# Patient Record
Sex: Female | Born: 1940 | Race: White | Hispanic: No | Smoking: Former smoker
Health system: Southern US, Community
[De-identification: ages and names within clinical notes are randomized; demographics above are authoritative.]

## PROBLEM LIST (undated history)

## (undated) DIAGNOSIS — IMO0001 Reserved for inherently not codable concepts without codable children: Secondary | ICD-10-CM

## (undated) DIAGNOSIS — I2782 Chronic pulmonary embolism: Secondary | ICD-10-CM

## (undated) DIAGNOSIS — D3000 Benign neoplasm of unspecified kidney: Secondary | ICD-10-CM

## (undated) DIAGNOSIS — D494 Neoplasm of unspecified behavior of bladder: Secondary | ICD-10-CM

## (undated) DIAGNOSIS — M810 Age-related osteoporosis without current pathological fracture: Secondary | ICD-10-CM

## (undated) DIAGNOSIS — J439 Emphysema, unspecified: Secondary | ICD-10-CM

## (undated) DIAGNOSIS — Z9181 History of falling: Secondary | ICD-10-CM

## (undated) DIAGNOSIS — E039 Hypothyroidism, unspecified: Secondary | ICD-10-CM

## (undated) DIAGNOSIS — L900 Lichen sclerosus et atrophicus: Secondary | ICD-10-CM

## (undated) DIAGNOSIS — H269 Unspecified cataract: Secondary | ICD-10-CM

## (undated) DIAGNOSIS — F329 Major depressive disorder, single episode, unspecified: Secondary | ICD-10-CM

## (undated) DIAGNOSIS — Z5189 Encounter for other specified aftercare: Secondary | ICD-10-CM

## (undated) DIAGNOSIS — T7840XA Allergy, unspecified, initial encounter: Secondary | ICD-10-CM

## (undated) DIAGNOSIS — C801 Malignant (primary) neoplasm, unspecified: Secondary | ICD-10-CM

## (undated) DIAGNOSIS — I10 Essential (primary) hypertension: Secondary | ICD-10-CM

## (undated) DIAGNOSIS — F32A Depression, unspecified: Secondary | ICD-10-CM

## (undated) DIAGNOSIS — T8859XA Other complications of anesthesia, initial encounter: Secondary | ICD-10-CM

## (undated) DIAGNOSIS — F419 Anxiety disorder, unspecified: Secondary | ICD-10-CM

## (undated) DIAGNOSIS — E785 Hyperlipidemia, unspecified: Secondary | ICD-10-CM

## (undated) DIAGNOSIS — T4145XA Adverse effect of unspecified anesthetic, initial encounter: Secondary | ICD-10-CM

## (undated) HISTORY — DX: Unspecified cataract: H26.9

## (undated) HISTORY — DX: Emphysema, unspecified: J43.9

## (undated) HISTORY — PX: POLYPECTOMY: SHX149

## (undated) HISTORY — DX: Major depressive disorder, single episode, unspecified: F32.9

## (undated) HISTORY — DX: Lichen sclerosus et atrophicus: L90.0

## (undated) HISTORY — DX: Encounter for other specified aftercare: Z51.89

## (undated) HISTORY — DX: Allergy, unspecified, initial encounter: T78.40XA

## (undated) HISTORY — DX: Benign neoplasm of unspecified kidney: D30.00

## (undated) HISTORY — DX: Hypothyroidism, unspecified: E03.9

## (undated) HISTORY — DX: Malignant (primary) neoplasm, unspecified: C80.1

## (undated) HISTORY — DX: Age-related osteoporosis without current pathological fracture: M81.0

## (undated) HISTORY — DX: Anxiety disorder, unspecified: F41.9

## (undated) HISTORY — DX: Essential (primary) hypertension: I10

## (undated) HISTORY — PX: COLONOSCOPY: SHX174

## (undated) HISTORY — DX: Depression, unspecified: F32.A

## (undated) HISTORY — DX: Hyperlipidemia, unspecified: E78.5

---

## 1968-12-16 HISTORY — PX: VAGINAL HYSTERECTOMY: SUR661

## 2008-04-23 ENCOUNTER — Encounter: Admission: RE | Admit: 2008-04-23 | Discharge: 2008-04-23 | Payer: Self-pay | Admitting: Internal Medicine

## 2008-06-14 ENCOUNTER — Encounter: Admission: RE | Admit: 2008-06-14 | Discharge: 2008-06-14 | Payer: Self-pay | Admitting: Nephrology

## 2008-07-02 ENCOUNTER — Encounter: Admission: RE | Admit: 2008-07-02 | Discharge: 2008-07-02 | Payer: Self-pay | Admitting: Internal Medicine

## 2008-10-15 ENCOUNTER — Encounter: Admission: RE | Admit: 2008-10-15 | Discharge: 2008-10-15 | Payer: Self-pay | Admitting: Nephrology

## 2009-01-15 ENCOUNTER — Encounter: Admission: RE | Admit: 2009-01-15 | Discharge: 2009-01-15 | Payer: Self-pay | Admitting: Internal Medicine

## 2009-02-01 DIAGNOSIS — E785 Hyperlipidemia, unspecified: Secondary | ICD-10-CM | POA: Insufficient documentation

## 2009-02-01 DIAGNOSIS — D3000 Benign neoplasm of unspecified kidney: Secondary | ICD-10-CM | POA: Insufficient documentation

## 2009-02-01 DIAGNOSIS — E039 Hypothyroidism, unspecified: Secondary | ICD-10-CM | POA: Insufficient documentation

## 2009-02-01 DIAGNOSIS — J439 Emphysema, unspecified: Secondary | ICD-10-CM

## 2009-02-02 ENCOUNTER — Ambulatory Visit: Payer: Self-pay | Admitting: Pulmonary Disease

## 2009-02-02 DIAGNOSIS — R0602 Shortness of breath: Secondary | ICD-10-CM | POA: Insufficient documentation

## 2009-02-10 ENCOUNTER — Telehealth: Payer: Self-pay | Admitting: Pulmonary Disease

## 2009-02-17 ENCOUNTER — Ambulatory Visit: Payer: Self-pay | Admitting: Pulmonary Disease

## 2009-02-17 DIAGNOSIS — J438 Other emphysema: Secondary | ICD-10-CM | POA: Insufficient documentation

## 2009-04-30 ENCOUNTER — Telehealth (INDEPENDENT_AMBULATORY_CARE_PROVIDER_SITE_OTHER): Payer: Self-pay | Admitting: *Deleted

## 2009-05-19 ENCOUNTER — Ambulatory Visit: Payer: Self-pay | Admitting: Pulmonary Disease

## 2009-06-28 ENCOUNTER — Encounter: Admission: RE | Admit: 2009-06-28 | Discharge: 2009-06-28 | Payer: Self-pay | Admitting: Family Medicine

## 2009-11-25 ENCOUNTER — Ambulatory Visit: Payer: Self-pay | Admitting: Pulmonary Disease

## 2009-11-29 ENCOUNTER — Telehealth: Payer: Self-pay | Admitting: Pulmonary Disease

## 2009-12-02 ENCOUNTER — Encounter: Admission: RE | Admit: 2009-12-02 | Discharge: 2009-12-02 | Payer: Self-pay | Admitting: Nephrology

## 2010-03-16 ENCOUNTER — Telehealth: Payer: Self-pay | Admitting: Pulmonary Disease

## 2010-03-22 ENCOUNTER — Telehealth (INDEPENDENT_AMBULATORY_CARE_PROVIDER_SITE_OTHER): Payer: Self-pay | Admitting: *Deleted

## 2010-03-31 ENCOUNTER — Telehealth (INDEPENDENT_AMBULATORY_CARE_PROVIDER_SITE_OTHER): Payer: Self-pay | Admitting: *Deleted

## 2010-04-12 ENCOUNTER — Telehealth (INDEPENDENT_AMBULATORY_CARE_PROVIDER_SITE_OTHER): Payer: Self-pay | Admitting: *Deleted

## 2010-04-19 ENCOUNTER — Telehealth (INDEPENDENT_AMBULATORY_CARE_PROVIDER_SITE_OTHER): Payer: Self-pay | Admitting: *Deleted

## 2010-05-06 ENCOUNTER — Inpatient Hospital Stay (HOSPITAL_COMMUNITY)
Admission: EM | Admit: 2010-05-06 | Discharge: 2010-05-16 | Payer: Self-pay | Source: Home / Self Care | Attending: Internal Medicine | Admitting: Internal Medicine

## 2010-05-06 ENCOUNTER — Emergency Department (HOSPITAL_BASED_OUTPATIENT_CLINIC_OR_DEPARTMENT_OTHER)
Admission: EM | Admit: 2010-05-06 | Discharge: 2010-05-06 | Disposition: A | Discharge status: Other Acute Inpatient Hospital | Payer: Self-pay | Source: Home / Self Care | Admitting: Emergency Medicine

## 2010-05-07 HISTORY — PX: TOTAL SHOULDER REPLACEMENT: SUR1217

## 2010-05-07 HISTORY — PX: OTHER SURGICAL HISTORY: SHX169

## 2010-05-07 NOTE — H&P (Signed)
[name], [name]               ACCOUNT NO.:  [id number]  MEDICAL RECORD NO.:  [id number]          PATIENT TYPE:  INP  LOCATION:  1614                         FACILITY:  [facility]  PHYSICIAN:  Ruthy Dick, MD    DATE OF BIRTH:  02/04/1941  DATE OF ADMISSION:  05/06/2010 DATE OF DISCHARGE:                             HISTORY & PHYSICAL   The patient seen and examined on the 6th floor at [facility].  PRIMARY CARE PHYSICIAN:  Dr. Charlesetta Shanks.  CHIEF COMPLAINT:  History of fall today with injury to right shoulder and right hip.  HISTORY OF PRESENT ILLNESS:  [name] is a pleasant 70 year old Caucasian lady with a medical history significant for dyslipidemia, COPD, depression, hypothyroidism, hypertension who was taken to the emergency room at [facility] after she had fallen down at home.  The patient said that she was going about her business this morning and on trying to descend the staircase she fell down, midway down the staircase.  When she landed, she landed on her right shoulder and her right hip and also hit the wall in the process of landing.  She was in pain, but she thought at that time that she hit her head, but the head was not hurting.  Because of the pain, she managed to crawl down through the staircase where she took the phone and called her granddaughter. The granddaughter came and on her way down she called the EMS who also came to the house and got her.  Because of how bad it was, she was unable to open the door for the EMS because they arrived before the granddaughter.  Later on, the granddaughter arrived and opened the door for them.  The patient was taken to the [place] [facility].  Over there at [facility] Emergency Room, x-rays were done and the patient was noted to have a subcapital slightly impacted fracture of the right femoral neck and was also noted to have a dislocation of the humeral head with respect to the glenoid and a mildly  displaced fracture at the base of the humeral head.  Chest x-ray was done and was said to have only COPD and emphysema, comminuted right humeral neck fracture.  No acute cardiopulmonary distress.  X-rays of the tibia were normal and x-ray of the knees were also normal.  The patient states that prior to this episode, she had no complaints whatsoever.  There was no chest pain before this episode and there was no chest pain after.  No dysuria, no frequency, no urgency. The patient denies any palpitations.  Denies diplopia, photophobia and syncope.  She specifically says she did not black out during this episode.  She denies melanotic stool, diarrhea, constipation.  Denies hematochezia.  The patient denies abdominal pain.  PAST MEDICAL HISTORY: 1. Hypertension. 2. Hypothyroidism. 3. Dyslipidemia. 4. Depression. 5. COPD.  MEDICATIONS: 1. Aspirin 81 mg daily. 2. Bupropion 200 mg daily. 3. Coreg 12.5 mg daily. 4. Clonazepam 0.5 mg daily. 5. Levothroid 75 mcg daily. 6. Nifedipine XR 60 mg daily. 7. Pravastatin 40 mg daily. 8. ProAir inhaler and Spiriva inhaler  once daily. 9. Symbicort inhaler twice daily.  The patient's condition was presented orthopedic surgery from the [facility].  Orthopedic surgery recommended at [facility] to admit the patient and they would come in and consult on the patient.  SOCIAL HISTORY:  The patient lives alone.  She quit smoking around year 2000.  Denies alcohol, illicit drug use.  ALLERGIES:  PENICILLIN WHICH CAUSES ANAPHYLACTIC REACTION.  FAMILY HISTORY:  Significant for stroke, father had stroke at the age of around 19.  Mother is around age of 52, still alive but has had numerous minor strokes.  REVIEW OF SYSTEMS:  All system reviewed but negative except noted in history of presenting illness.  PHYSICAL EXAMINATION:  GENERAL:  The patient seen and examined in the emergency room.  She is alert and oriented x3, in no  acute cardiopulmonary distress and in no painful distress either. HEENT:  Normocephalic, atraumatic.  Pupils equal, round, reactive to light.  Extraocular muscles intact.  Nares patent. NECK:  Supple.  No JVD.  No lymphadenopathy, no thyromegaly. CHEST:  Clear to auscultation bilaterally.  No rhonchi.  No rales, no wheezing. ABDOMEN:  Soft, nontender.  No hepatosplenomegaly. EXTREMITIES:  No clubbing, no cyanosis and no edema. CARDIOVASCULAR SYSTEM:  First and second heart sounds only. CENTRAL NERVOUS SYSTEM:  Nonfocal.  Cranial nerves intact, II - XII. EXTREMITIES:  Right upper extremity is in sling and the joint contours are irregular.  At the same time, the right lower extremity is laterally rotated, range of motion is reduced. VITAL SIGNS:  Temperature 98.4, pulse 80, respirations 16, blood pressure 123/61 and saturating 94% on 2 L.  LABS AND INVESTIGATIONS:  X-ray is as noted above in the history of presenting illness shows both dislocation and fracture of the right shoulder and also a fracture of the right hip femoral head.  WBCs 24,000 with 86% neutrophils, platelets 441, hemoglobin 13, hematocrit 39. Compressive metabolic profile is fairly normal.  INR is 0.94.  ASSESSMENT: 1. Right shoulder fracture and dislocation. 2. Right hip fracture. 3. Leukocytosis, questionable etiology. 4. Hypertension. 5. Hypothyroidism. 6. Dyslipidemia. 7. Depression. 8. Chronic obstructive pulmonary disease. 9. Status post fall.  PLAN OF CARE:  We will admit the patient, start gentle hydration, pain management will be started.  There is already sling to the right shoulder.  DVT prophylaxis.  We will get an EKG and also get a UA and urine culture and sensitivity.  Chest x-ray has already been done and is fairly normal.  We will restart on outpatient medications and she is already on a beta-blocker.  Depending on the results of the EKG, we will clear the patient for surgery tomorrow but at  this time without seeing the EKG, we have not yet cleared her for surgery.  Total time used for admitting this patient is 1 hour.  I have discussed code status with the patient and she states she wants to be a do not resuscitate, do not intubate.     Ruthy Dick, MD     GU/MEDQ  D:  05/06/2010  T:  05/06/2010  Job:  [id number]  cc:   Charlesetta Shanks  Electronically Signed by Ruthy Dick  on 05/07/2010 11:23:28 AM

## 2010-05-08 ENCOUNTER — Encounter: Payer: Self-pay | Admitting: Nephrology

## 2010-05-09 LAB — DIFFERENTIAL
Basophils Absolute: 0 10*3/uL (ref 0.0–0.1)
Lymphocytes Relative: 7 % — ABNORMAL LOW (ref 12–46)
Monocytes Absolute: 1.7 10*3/uL — ABNORMAL HIGH (ref 0.1–1.0)
Monocytes Relative: 7 % (ref 3–12)
Neutro Abs: 21.1 10*3/uL — ABNORMAL HIGH (ref 1.7–7.7)
Neutrophils Relative %: 86 % — ABNORMAL HIGH (ref 43–77)

## 2010-05-09 LAB — COMPREHENSIVE METABOLIC PANEL
AST: 24 U/L (ref 0–37)
BUN: 15 mg/dL (ref 6–23)
CO2: 25 mEq/L (ref 19–32)
Calcium: 9.9 mg/dL (ref 8.4–10.5)
Creatinine, Ser: 0.9 mg/dL (ref 0.4–1.2)
Sodium: 144 mEq/L (ref 135–145)

## 2010-05-09 LAB — CBC
HCT: 39.8 % (ref 36.0–46.0)
RBC: 4.7 MIL/uL (ref 3.87–5.11)
WBC: 24.5 10*3/uL — ABNORMAL HIGH (ref 4.0–10.5)

## 2010-05-09 LAB — PROTIME-INR: Prothrombin Time: 12.8 seconds (ref 11.6–15.2)

## 2010-05-10 LAB — CBC
HCT: 35.1 % — ABNORMAL LOW (ref 36.0–46.0)
Hemoglobin: 11.5 g/dL — ABNORMAL LOW (ref 12.0–15.0)
Hemoglobin: 8.2 g/dL — ABNORMAL LOW (ref 12.0–15.0)
MCH: 28.8 pg (ref 26.0–34.0)
MCHC: 32.8 g/dL (ref 30.0–36.0)
MCHC: 32.7 g/dL (ref 30.0–36.0)
MCV: 87.2 fL (ref 78.0–100.0)
Platelets: 236 10*3/uL (ref 150–400)
RBC: 3.98 MIL/uL (ref 3.87–5.11)
RBC: 2.85 MIL/uL — ABNORMAL LOW (ref 3.87–5.11)
RDW: 12.9 % (ref 11.5–15.5)
RDW: 13 % (ref 11.5–15.5)
WBC: 16.2 10*3/uL — ABNORMAL HIGH (ref 4.0–10.5)
WBC: 20.8 10*3/uL — ABNORMAL HIGH (ref 4.0–10.5)

## 2010-05-10 LAB — BASIC METABOLIC PANEL
BUN: 8 mg/dL (ref 6–23)
Calcium: 8 mg/dL — ABNORMAL LOW (ref 8.4–10.5)
Chloride: 107 mEq/L (ref 96–112)
Chloride: 106 mEq/L (ref 96–112)
Creatinine, Ser: 0.81 mg/dL (ref 0.4–1.2)
Creatinine, Ser: 0.89 mg/dL (ref 0.4–1.2)
GFR calc Af Amer: >60 mL/min (ref >60)
Glucose, Bld: 135 mg/dL — ABNORMAL HIGH (ref 70–99)
Glucose, Bld: 133 mg/dL — ABNORMAL HIGH (ref 70–99)
Sodium: 136 mEq/L (ref 135–145)

## 2010-05-10 LAB — URINALYSIS, ROUTINE W REFLEX MICROSCOPIC
Bilirubin Urine: NEGATIVE
Ketones, ur: NEGATIVE mg/dL
Protein, ur: 100 mg/dL — AB
Specific Gravity, Urine: 1.022 (ref 1.005–1.030)
Urine Glucose, Fasting: NEGATIVE mg/dL
Urobilinogen, UA: 0.2 mg/dL (ref 0.0–1.0)
pH: 5.5 (ref 5.0–8.0)

## 2010-05-10 LAB — MAGNESIUM: Magnesium: 1.6 mg/dL (ref 1.5–2.5)

## 2010-05-10 LAB — DIFFERENTIAL
Basophils Relative: 0 % (ref 0–1)
Eosinophils Absolute: 0 10*3/uL (ref 0.0–0.7)
Eosinophils Relative: 0 % (ref 0–5)
Lymphs Abs: 1.1 10*3/uL (ref 0.7–4.0)
Monocytes Absolute: 2.2 10*3/uL — ABNORMAL HIGH (ref 0.1–1.0)

## 2010-05-10 LAB — URINE MICROSCOPIC-ADD ON

## 2010-05-10 LAB — URINE CULTURE
Colony Count: 25000
Culture  Setup Time: 201201211336
Special Requests: NEGATIVE

## 2010-05-10 LAB — COMPREHENSIVE METABOLIC PANEL
ALT: 14 U/L (ref 0–35)
AST: 21 U/L (ref 0–37)
CO2: 22 mEq/L (ref 19–32)
Calcium: 7.6 mg/dL — ABNORMAL LOW (ref 8.4–10.5)
Total Bilirubin: 0.4 mg/dL (ref 0.3–1.2)

## 2010-05-10 LAB — ABO/RH: ABO/RH(D): O POS

## 2010-05-11 LAB — TYPE AND SCREEN
ABO/RH(D): O POS
Antibody Screen: NEGATIVE

## 2010-05-11 LAB — RETICULOCYTES
RBC.: 4.06 MIL/uL (ref 3.87–5.11)
Retic Count, Absolute: 69 10*3/uL (ref 19.0–186.0)
Retic Ct Pct: 1.7 % (ref 0.4–3.1)

## 2010-05-11 LAB — CBC
HCT: 23.5 % — ABNORMAL LOW (ref 36.0–46.0)
Hemoglobin: 7.9 g/dL — ABNORMAL LOW (ref 12.0–15.0)
Hemoglobin: 13.2 g/dL (ref 12.0–15.0)
MCH: 29.2 pg (ref 26.0–34.0)
MCH: 28.5 pg (ref 26.0–34.0)
MCHC: 34.2 g/dL (ref 30.0–36.0)
MCHC: 34 g/dL (ref 30.0–36.0)
MCV: 86.7 fL (ref 78.0–100.0)
Platelets: 235 10*3/uL (ref 150–400)
Platelets: 249 10*3/uL (ref 150–400)
Platelets: 275 10*3/uL (ref 150–400)
RBC: 2.71 MIL/uL — ABNORMAL LOW (ref 3.87–5.11)
RBC: 4.63 MIL/uL (ref 3.87–5.11)
RDW: 13.7 % (ref 11.5–15.5)
WBC: 12.7 10*3/uL — ABNORMAL HIGH (ref 4.0–10.5)

## 2010-05-11 LAB — PREPARE RBC (CROSSMATCH)

## 2010-05-11 LAB — BASIC METABOLIC PANEL
BUN: 11 mg/dL (ref 6–23)
CO2: 23 mEq/L (ref 19–32)
CO2: 26 mEq/L (ref 19–32)
Calcium: 8.6 mg/dL (ref 8.4–10.5)
Chloride: 110 mEq/L (ref 96–112)
Chloride: 105 mEq/L (ref 96–112)
Creatinine, Ser: 0.88 mg/dL (ref 0.4–1.2)
Creatinine, Ser: 0.95 mg/dL (ref 0.4–1.2)
GFR calc Af Amer: >60 mL/min (ref >60)
Glucose, Bld: 128 mg/dL — ABNORMAL HIGH (ref 70–99)
Potassium: 4.2 mEq/L (ref 3.5–5.1)
Sodium: 138 mEq/L (ref 135–145)

## 2010-05-11 LAB — URINE CULTURE
Colony Count: NO GROWTH
Culture  Setup Time: 201201231124
Culture: NO GROWTH

## 2010-05-11 LAB — FOLATE: Folate: 9.5 ng/mL

## 2010-05-12 ENCOUNTER — Encounter: Payer: Self-pay | Admitting: Internal Medicine

## 2010-05-12 DIAGNOSIS — J9600 Acute respiratory failure, unspecified whether with hypoxia or hypercapnia: Secondary | ICD-10-CM

## 2010-05-12 DIAGNOSIS — J449 Chronic obstructive pulmonary disease, unspecified: Secondary | ICD-10-CM

## 2010-05-12 LAB — CBC
HCT: 34.3 % — ABNORMAL LOW (ref 36.0–46.0)
Hemoglobin: 11.6 g/dL — ABNORMAL LOW (ref 12.0–15.0)
RBC: 4.02 MIL/uL (ref 3.87–5.11)

## 2010-05-12 LAB — IRON AND TIBC
Saturation Ratios: 6 % — ABNORMAL LOW (ref 20–55)
UIBC: 159 ug/dL

## 2010-05-13 DIAGNOSIS — I2699 Other pulmonary embolism without acute cor pulmonale: Secondary | ICD-10-CM

## 2010-05-13 LAB — DIFFERENTIAL
Lymphocytes Relative: 13 % (ref 12–46)
Lymphs Abs: 1.8 10*3/uL (ref 0.7–4.0)
Monocytes Absolute: 1.6 10*3/uL — ABNORMAL HIGH (ref 0.1–1.0)
Monocytes Relative: 11 % (ref 3–12)
Neutro Abs: 10.9 10*3/uL — ABNORMAL HIGH (ref 1.7–7.7)
Neutrophils Relative %: 75 % (ref 43–77)

## 2010-05-13 LAB — CBC
HCT: 35 % — ABNORMAL LOW (ref 36.0–46.0)
Hemoglobin: 11.7 g/dL — ABNORMAL LOW (ref 12.0–15.0)
MCH: 28.4 pg (ref 26.0–34.0)
MCHC: 33.4 g/dL (ref 30.0–36.0)
MCV: 85 fL (ref 78.0–100.0)
RBC: 4.12 MIL/uL (ref 3.87–5.11)

## 2010-05-14 LAB — CBC
MCH: 28.7 pg (ref 26.0–34.0)
MCHC: 33.7 g/dL (ref 30.0–36.0)
MCV: 85.3 fL (ref 78.0–100.0)
Platelets: 337 10*3/uL (ref 150–400)
RBC: 4.35 MIL/uL (ref 3.87–5.11)

## 2010-05-14 LAB — CULTURE, BLOOD (ROUTINE X 2): Culture  Setup Time: 201201221739

## 2010-05-14 LAB — DIFFERENTIAL
Eosinophils Absolute: 0.4 10*3/uL (ref 0.0–0.7)
Eosinophils Relative: 2 % (ref 0–5)
Lymphs Abs: 1.8 10*3/uL (ref 0.7–4.0)
Monocytes Absolute: 2.5 10*3/uL — ABNORMAL HIGH (ref 0.1–1.0)
Monocytes Relative: 12 % (ref 3–12)
Neutrophils Relative %: 76 % (ref 43–77)

## 2010-05-14 LAB — BASIC METABOLIC PANEL
BUN: 6 mg/dL (ref 6–23)
Calcium: 8.7 mg/dL (ref 8.4–10.5)
Chloride: 100 mEq/L (ref 96–112)
Creatinine, Ser: 0.82 mg/dL (ref 0.4–1.2)

## 2010-05-15 LAB — CBC
MCH: 28.2 pg (ref 26.0–34.0)
MCV: 86.3 fL (ref 78.0–100.0)
Platelets: 348 10*3/uL (ref 150–400)
RDW: 13.5 % (ref 11.5–15.5)
WBC: 20 10*3/uL — ABNORMAL HIGH (ref 4.0–10.5)

## 2010-05-15 LAB — DIFFERENTIAL
Basophils Relative: 0 % (ref 0–1)
Eosinophils Absolute: 0.4 10*3/uL (ref 0.0–0.7)
Eosinophils Relative: 2 % (ref 0–5)
Lymphs Abs: 2 10*3/uL (ref 0.7–4.0)
Neutrophils Relative %: 76 % (ref 43–77)

## 2010-05-16 LAB — CBC
Hemoglobin: 12 g/dL (ref 12.0–15.0)
MCHC: 32.3 g/dL (ref 30.0–36.0)
RDW: 13.3 % (ref 11.5–15.5)
WBC: 17.5 10*3/uL — ABNORMAL HIGH (ref 4.0–10.5)

## 2010-05-17 NOTE — Consult Note (Addendum)
NAMEROBYNN, [name]               ACCOUNT NO.:  [id number]  MEDICAL RECORD NO.:  [id number]          PATIENT TYPE:  INP  LOCATION:  1614                         FACILITY:  [facility]  PHYSICIAN:  Casimiro Needle B. Sherene Sires, MD, FCCPDATE OF BIRTH:  06-02-1940  DATE OF CONSULTATION:  05/12/2010 DATE OF DISCHARGE:                                CONSULTATION   REASON FOR CONSULTATION:  Respiratory failure postop, abnormal CT scan.  HISTORY:  This a very complicated 69-year white female, remote smoker, who carries a diagnosis stage III COPD with emphysematous features, who typically is not oxygen dependent at baseline but is short of breath with anything  more than slow ADLs.  She fell injuring her right shoulder and hip pain requiring hospitalization on January 21 and emergent right shoulder reversed total shoulder arthroplasty as well as right femoral neck ORIF.  Postoperatively, she has had difficulty maintaining adequate saturations on room air and has been placed on nasal oxygen, with somewhat of congested sounding cough and a CT scan was done to evaluate her hypoxemia and was found to have multiple, very subtle subsegmental and smaller  partial defects consistent with pulmonary emboli of unclear chronicity.  A venous Doppler was subsequently done showing no evidence of active DVT.  The patient denies any pleuritic pain, states she is no more short of breath than usual.  Denies any hemoptysis or increased cough over baseline, leg swelling, or previous history of blood clots.  PAST MEDICAL HISTORY: 1. COPD.  PFTs reviewed from February 17, 2009 indicating FEV1 of 0.94     which 40% predicted, ratio of 34 and no better after beta-2    DLCO 62%. 2. History of hyperlipidemia. 3. Hypothyroidism.  MEDICATIONS:  As an outpatient, she had been maintained on: 1. Baby aspirin. 2. Pravachol. 3. Coreg. 4. Nifedipine. 5. Levothyroxine. 6. Bupropion. 7. Clonazepam. 8. Symbicort. 9.  Spiriva. 10.ProAir.  ALLERGIES:  PENICILLIN, NONSPECIFIC REACTIONS.  FAMILY HISTORY:  Positive heart disease in her mother and father and stroke in her mother noted.  No history of DVT or pulmonary embolism to her knowledge.  REVIEW OF SYSTEMS:  She is divorced, retired Control and instrumentation engineer. She quit smoking in 2000.  She denies any excess alcohol use.  REVIEW OF SYMPTOMS:  All systems taken in detail are negative, except as outlined above.  There has been no postoperative problem with difficulty swallowing or calf pain.  PHYSICAL EXAMINATION:  GENERAL:  This is a chronically ill elderly white female who does not appear acutely ill or uncomfortable. VITAL SIGNS:  She is afebrile on vital signs. HEENT:  Oropharynx is clear. NECK:  Supple without cervical adenopathy or tenderness.  Trachea is midline. LUNGS:  Lung fields reveal diminished breath sounds with hyperresonant fashion.  A few crackles on inspiration, a few rhonchi on expiration but these are minimal. HEART:  Regular rhythm without murmur, gallop, or rub.  No increase in P2. ABDOMEN:  Soft and benign with no palpable organomegaly, mass, or tenderness. EXTREMITIES:  Warm without calf tenderness, cyanosis, clubbing, or edema.  PAS hose in place.  I did not examine her surgical wounds in her right  shoulder or right hip.  LABORATORY DATA:  Her hematocrit is 34.3 with an MCV of 85.  This is up from her baseline of hgb of 7.9 on admission.  Her saturations are adequate on 2 L.  IMPRESSION:  Postoperative respiratory failure in the patient GOLD III chronic obstructive pulmonary disease with minimal congested cough.  No clinical history to suggest either significant active bronchospasm and no CT evidence by chest CT scan for significant atelectasis.  I suspect the hypoxemia is unrelated to the more worrisome finding of multiple subsegmental defects consistent with pulmonary emboli.  The issue is how long she has had the  emboli and whether she is at risk for future emboli.  Since she has no active DVT and does not feel any different than normal, I suspect that the pulmonary emboli and the defects she has are longstanding and do not essentially require acute anticoagulation because of the risk of bleeding nor is there any indication for immediate venous Doppler because of the risk of recurrent DVT or life threatening pulmonary embolism at this point.  She has adequate  both circulatory and respiratory reserved to be treated conservatively with PAS hose and subcu heparin and early mobilization.  If she begins to bleed on this, then I would stop the heparin altogether.  If she has any evidence either radiographically or clinically of recurrent pulmonary embolism, then venous Doppler needs to be considered emergently.  I discussed these risks with the patient and her  family and they are in agreement with conservative rx here.     Brooke Dalton. Sherene Sires, MD, [facility]     MBW/MEDQ  D:  05/12/2010  T:  05/12/2010  Job:  [id number]  Electronically Signed by Sandrea Hughs MD FCCP on 05/17/2010 09:32:13 AM

## 2010-05-19 NOTE — Progress Notes (Signed)
Summary: sinus/ chest congestion  Phone Note Call from Patient Call back at Home Phone [phone]   Caller: Patient Call For: clance Summary of Call: pt c/o sinus drainage x 1 wk that has now began to make her cough/ clear mucus (although she is not coughing much). she takes 1000 mg of vitamin c twice daily (am and pm). also drinking a lot of fluids and taking musinex. pt does not necessarily want an abx but wants to know what else she can do to keep this from "settling in her chest" and worsening. [facility] [employer].  Initial call taken by: Tivis Ringer, CNA,  April 19, 2010 1:52 PM  Follow-up for Phone Call        Spoke with pt.  She is c/o "sinus drainage" x 1 wk- for the past several days she has started to notice some coughing with minimal clear sputum.  She states that she does not feel bad, just worried that the drainage will "settle in chest"- she has been taking mucinex, vitamin c and has increased her fluids.  She would like to know what else she can take otc.  Pt states not urgent and msg can be addressed tommorrow.  Will forward to [facility], pls advise thanks Follow-up by: Vernie Murders,  April 19, 2010 3:55 PM  Additional Follow-up for Phone Call Additional follow up Details #1::        can try chlorpheniramine 8mg  and take at bedtime to help dry up her nose.  would continue with mucinex as well until she is better.  IF it does go to her chest, just give [place] a call back.  Additional Follow-up by: Barbaraann Share MD,  April 20, 2010 5:22 PM    Additional Follow-up for Phone Call Additional follow up Details #2::    Spoke with pt and notified of the above recs per [facility].  Pt verbalized understanding and states will call back if needed. Follow-up by: Vernie Murders,  April 20, 2010 5:28 PM

## 2010-05-19 NOTE — Progress Notes (Signed)
Summary: No thrush/restart Symbicort  Phone Note Call from Patient Call back at Home Phone [phone]   Caller: Patient Call For: Clance Summary of Call: Pt states she read the insert to Spiriva and it states if you have Kidney problems please inform your doctor. Pt c/o having stage III Kidney disease 1 1/2 years and states in August Dr. Darrick Penna advised her kidneys were working as they should. She has been rinsing mouth as KC directed and has not had any problems with thrush since stopping the Symbicort and rinsing well. Also pt wants to know if she can go back on Symbicort since it worked so well with Spiriva. Please advise. Thanks. (Pt aware KC is out of the office until 04/13/10) Initial call taken by: Zackery Barefoot CMA,  April 12, 2010 2:28 PM  Follow-up for Phone Call        she should stay on spiriva...won't bother her kidneys. can try symbicort again but keep mouth rinsed as much as possible.  if the thrush comes back, may not be a med she can stay on.  We could discuss alternatives. Follow-up by: Barbaraann Share MD,  April 13, 2010 2:03 PM  Additional Follow-up for Phone Call Additional follow up Details #1::        Spoke with pt and notified of the above recs per [facility].  Pt verbalized understanding.  Additional Follow-up by: Vernie Murders,  April 13, 2010 2:06 PM

## 2010-05-19 NOTE — Progress Notes (Signed)
Summary: thrush again  Phone Note Call from Patient Call back at [facility] Phone [phone]   Caller: Patient Call For: clance Summary of Call: pt says she has finished w/ nystatin (for thrush) but thrush has returned. [employer].  Initial call taken by: Tivis Ringer, CNA,  March 31, 2010 2:35 PM  Follow-up for Phone Call        Spoke with pt.  She is c/o white film on her toungue since this am.  She states that her mouth is not sore, and denies any other c/o's.  She states that the only inhaled med she is currently using is the spiriva and she does not rinse her mouth after using this.  We just called her in nystatin sol on 03/22/10.  She is requesting more. Pls advise tanks! Follow-up by: Vernie Murders,  March 31, 2010 3:04 PM  Additional Follow-up for Phone Call Additional follow up Details #1::        if she has only had for one day, and is not sore, lets hold off on further meds and have her rinse well after using spiriva. Additional Follow-up by: Barbaraann Share MD,  March 31, 2010 3:06 PM    Additional Follow-up for Phone Call Additional follow up Details #2::    Spoke with pt and notified of recs per [facility].  She verbalized understanding. Follow-up by: Vernie Murders,  March 31, 2010 3:09 PM

## 2010-05-19 NOTE — Assessment & Plan Note (Signed)
Summary: rov for emphysema   Primary Provider/Referring Provider:  Raechel Chute  CC:  Pt is here for a 6 month f/u appt on her emphysema.   Pt states she has noticed no change in her breathing since last visit.   pt states she will go to the grocery store where it is cool and walk the isles for exercise.  Pt states she hasn't needed to use rescue hfa.  pt does c/o headaches and PND. Marland Kitchen  History of Present Illness: The pt comes in today for f/u of her know emphysema.  She is doing very well, and is maintaining a stable baseline wrt exertional tolerance.  She has not had any flareups since last visit, but did have one episode of sinusitis.  She is staying active, and has no issues with her meds.  Current Medications (verified): 1)  Aspirin Ec Low Dose 81 Mg Tbec (Aspirin) .... Take 1 Tablet By Mouth Once A Day 2)  Pravastatin Sodium 40 Mg Tabs (Pravastatin Sodium) .Marland Kitchen.. 1 Once Daily 3)  Carvedilol 12.5 Mg Tabs (Carvedilol) .Marland Kitchen.. 1 Two Times A Day 4)  Nifedipine 60 Mg Xr24h-Tab (Nifedipine) .Marland Kitchen.. 1 Once Daily 5)  Levothroid 75 Mcg Tabs (Levothyroxine Sodium) .... Take 1 Tablet By Mouth Once A Day 6)  Bupropion Hcl 200 Mg Xr12h-Tab (Bupropion Hcl) .Marland Kitchen.. 1 Once Daily 7)  Clonazepam 0.5 Mg Tabs (Clonazepam) .Marland Kitchen.. 1 Once Daily As Needed 8)  Symbicort 160-4.5 Mcg/act  Aero (Budesonide-Formoterol Fumarate) .... Two Puffs Twice Daily 9)  Spiriva Handihaler 18 Mcg  Caps (Tiotropium Bromide Monohydrate) .... One Puff in Handihaler Daily 10)  Proair Hfa 108 (90 Base) Mcg/act  Aers (Albuterol Sulfate) .... 2 Puffs Every 4-6 Hours As Needed  Allergies (verified): 1)  ! Pcn  Review of Systems       The patient complains of headaches.  The patient denies shortness of breath with activity, shortness of breath at rest, productive cough, non-productive cough, coughing up blood, chest pain, irregular heartbeats, acid heartburn, indigestion, loss of appetite, weight change, abdominal pain, difficulty  swallowing, sore throat, tooth/dental problems, nasal congestion/difficulty breathing through nose, sneezing, itching, ear ache, anxiety, depression, hand/feet swelling, joint stiffness or pain, rash, change in color of mucus, and fever.    Vital Signs:  Patient profile:   70 year old female Height:      63 inches Weight:      131.38 pounds BMI:     23.36 O2 Sat:      94 % on Room air Temp:     98.5 degrees F oral Pulse rate:   83 / minute BP sitting:   128 / 80  (left arm) Cuff size:   regular  Vitals Entered By: Arman Filter LPN (November 25, 2009 1:41 PM)  O2 Flow:  Room air CC: Pt is here for a 6 month f/u appt on her emphysema.   Pt states she has noticed no change in her breathing since last visit.   pt states she will go to the grocery store where it is cool and walk the isles for exercise.  Pt states she hasn't needed to use rescue hfa.  pt does c/o headaches and PND.  Comments Medications reviewed with patient Arman Filter LPN  November 25, 2009 1:41 PM    Physical Exam  General:  wd female in nad Lungs:  clear to auscultation no wheezing or rhonchi Heart:  rrr, no mrg Extremities:  no edema or cyanosis  Neurologic:  alert and oriented,  moves all 4.   Impression & Recommendations:  Problem # 1:  EMPHYSEMA, SEVERE (ICD-492.8)  the pt is doing very well on her current regimen, and is working on an exercise program as well.  She is satisfied with her exertional tolerance, and has not had any acute exacerbation since her last visit.  I have asked her to continue with current inhaler regimen.  Medications Added to Medication List This Visit: 1)  Levothroid 75 Mcg Tabs (Levothyroxine sodium) .... Take 1 tablet by mouth once a day  Other Orders: Est. Patient Level III (16109)  Patient Instructions: 1)  no change in meds 2)  continue with your exercise program 3)  followup with me in 6mos

## 2010-05-19 NOTE — Progress Notes (Signed)
Summary: thrush  Phone Note Call from Patient   Caller: Patient Call For: clance Summary of Call: symbicort is causing thrush  Initial call taken by: Rickard Patience,  April 30, 2009 1:08 PM  Follow-up for Phone Call        called, spoke with pt.  Pt states she has thrush from symbicort.  was seen by PCP and given diflucan to treat it.  Pt states she is rinsing mouth out really well after using the inhaler.  would like to know if the symbicort can be changed to something else that wouldn't cause thrush.  WIll forward to [facility] advise.  Thanks! Follow-up by: Gweneth Dimitri RN,  April 30, 2009 2:20 PM  Additional Follow-up for Phone Call Additional follow up Details #1::        let her know that most alternatives are actually worse than symbicort.  there is one option that is better, but would require 2 different meds rather than one.  The best way to prevent is to try rinsing, gargling, then swallow.  the diflucan should get rid of current issue.  let us know if keeps having issue. Additional Follow-up by: Barbaraann Share MD,  April 30, 2009 5:13 PM    Additional Follow-up for Phone Call Additional follow up Details #2::    called, spoke with pt.  Informed her of above recs per KC. She verbalized understanding.   Follow-up by: Gweneth Dimitri RN,  April 30, 2009 5:16 PM

## 2010-05-19 NOTE — Progress Notes (Signed)
Summary: thrush  Phone Note Call from Patient Call back at [facility] Phone [phone]   Caller: Patient Call For: clance Reason for Call: Talk to Nurse Summary of Call: Patient was advised to stop symbicort last week, because of thrush.  Patient states that thrush was getting better, but this morning it is back.   Asking what we recommend now. Initial call taken by: Lehman Prom,  March 22, 2010 8:25 AM  Follow-up for Phone Call        called spoke with patient who states that she was doing well off the symbicort both with breathing and the thrush, but this morning noticed a white paste on her tongue.  pt denies sore throat, sores in mouth.  would ilke to know KC's recs.  please advise if pt may have MMW or if you would like her to contact her PCP, thanks!  allergies:  pcn.  [facility]. Follow-up by: Boone Master CNA/MA,  March 22, 2010 9:33 AM  Additional Follow-up for Phone Call Additional follow up Details #1::        try nystatin suspension, 400,000 International Units swish and swallow three times a day for 5 days qs, no fills. Additional Follow-up by: Barbaraann Share MD,  March 22, 2010 12:27 PM    Additional Follow-up for Phone Call Additional follow up Details #2::    rx called to pharmacy and pt aware Follow-up by: Philipp Deputy CMA,  March 22, 2010 2:29 PM

## 2010-05-19 NOTE — Progress Notes (Signed)
Summary: PCP change--FYI   Phone Note Call from Patient   Caller: Patient Call For: clance Summary of Call: failed to give some info to [facility] - Primary physician has changed to Dr. Charlesetta Shanks in same practice. Initial call taken by: Eugene Gavia,  November 29, 2009 2:07 PM  Follow-up for Phone Call        Spoke with pt.  Pt states she forgot to mention that she has switched PCP to Dr. Charlesetta Shanks.  Will forward to [facility] as FYI.  Gweneth Dimitri RN  November 29, 2009 2:17 PM   Additional Follow-up for Phone Call Additional follow up Details #1::        noted. Additional Follow-up by: Barbaraann Share MD,  November 29, 2009 5:19 PM

## 2010-05-19 NOTE — Assessment & Plan Note (Signed)
Summary: rov for emphysema   Copy to:  Raechel Chute Primary Provider/Referring Provider:  Raechel Chute  CC:  Pt is here for a 3 month f/u appt.  Pt states breathing "is working well" since starting Spiriva and increasing Symbicort to 160/4.5.  Pt denied a cough. Pt states she felt last week and hit chest and is having difficulty taking deep breaths.   .  History of Present Illness: the pt comes in today for f/u fo her known emphysema.  She was placed on an aggressive bronchodilator regimen last visit, and she has definitely seen an improvement in her exertional tolerance.  Her daughter has noticed this as well.  She denies any cough or purulence at this time, and has not had a flare of her disease since the last visit.    Current Medications (verified): 1)  Aspirin Ec Low Dose 81 Mg Tbec (Aspirin) .... Take 1 Tablet By Mouth Once A Day 2)  Pravastatin Sodium 40 Mg Tabs (Pravastatin Sodium) .Marland Kitchen.. 1 Once Daily 3)  Carvedilol 12.5 Mg Tabs (Carvedilol) .Marland Kitchen.. 1 Two Times A Day 4)  Nifedipine 60 Mg Xr24h-Tab (Nifedipine) .Marland Kitchen.. 1 Once Daily 5)  Levothroid 88 Mcg Tabs (Levothyroxine Sodium) .Marland Kitchen.. 1 Once Daily 6)  Bupropion Hcl 200 Mg Xr12h-Tab (Bupropion Hcl) .Marland Kitchen.. 1 Once Daily 7)  Clonazepam 0.5 Mg Tabs (Clonazepam) .Marland Kitchen.. 1 Once Daily As Needed 8)  Symbicort 160-4.5 Mcg/act  Aero (Budesonide-Formoterol Fumarate) .... Two Puffs Twice Daily 9)  Spiriva Handihaler 18 Mcg  Caps (Tiotropium Bromide Monohydrate) .... One Puff in Handihaler Daily 10)  Proair Hfa 108 (90 Base) Mcg/act  Aers (Albuterol Sulfate) .... 2 Puffs Every 4-6 Hours As Needed  Allergies (verified): 1)  ! Pcn  Review of Systems      See HPI  Vital Signs:  Patient profile:   70 year old female Height:      63 inches Weight:      119.13 pounds BMI:     21.18 O2 Sat:      96 % on Room air Temp:     98.1 degrees F oral Pulse rate:   86 / minute BP sitting:   114 / 70  (left arm) Cuff size:   regular  Vitals Entered  By: Arman Filter LPN (May 19, 2009 1:41 PM)  O2 Flow:  Room air CC: Pt is here for a 3 month f/u appt.  Pt states breathing "is working well" since starting Spiriva and increasing Symbicort to 160/4.5.  Pt denied a cough. Pt states she felt last week and hit chest and is having difficulty taking deep breaths.    Comments Medications reviewed with patient  Arman Filter LPN  May 19, 2009 1:44 PM    Physical Exam  General:  thin female in nad Lungs:  decreased bs, but no wheezing or rhonchi Heart:  rrr, no mrg Extremities:  no edema or cyanosis Neurologic:  alert and oriented, moves all 4.   Impression & Recommendations:  Problem # 1:  EMPHYSEMA, SEVERE (ICD-492.8) She feels that she is doing much better on her current regimen, but is concerned about the cost.  We can see if she qualifies for any of the patient assistant programs, and if not, can consider changing to one bronchodilator alone.  I have stressed to her again the importance of an exercise program, and to keep her eating up in order to minimize muscle loss.  If she is doing well, will see again in 6mos.  Other Orders: Est. Patient Level III (54098) Misc. Referral (Misc. Ref)  Patient Instructions: 1)  stay on current meds for now 2)  will see if you qualify for patient assistance program when you enter "doughnut hole" 3)  stay as active as possible. 4)  followup with me in 6mos or sooner if having issues.   Immunization History:  Influenza Immunization History:    Influenza:  historical (01/15/2009)  Pneumovax Immunization History:    Pneumovax:  historical (01/15/2009)

## 2010-05-19 NOTE — Progress Notes (Signed)
Summary: oral thrush  Phone Note Call from Patient Call back at [facility] Phone [phone]   Caller: Patient Call For: clance Summary of Call: pt c/o yeast infection in her mouth. she has been taking clindomycine for dental work. has been taking Catering manager as well for 10 days but this hasn't cleared up. pls advise. [facility] [address].  Initial call taken by: Tivis Ringer, CNA,  March 16, 2010 9:01 AM  Follow-up for Phone Call        Pt states she had been on Clindamycin for 10 days following a root canal at the end of Oct. 2011. She then developed oral thrush and both her dentist and PCP have given her: fluconazole 150mg  x6 days, MMW to swish and swallow for 5 days, Nystatin 100,000 for 5 days and Clotrimazole troches 5 daily for 10 days. Her mouth is no better and her PCP recommended she call pulmonologist because she is on Symbicort and this could be the cause of the thrush. She says she is brushing her tongue and rinsing well everytime she uses the Symbicort. Please advise with any recs. Follow-up by: Michel Bickers CMA,  March 16, 2010 10:27 AM  Additional Follow-up for Phone Call Additional follow up Details #1::        we can stop symbicort and see how she does. Additional Follow-up by: Barbaraann Share MD,  March 16, 2010 4:58 PM    Additional Follow-up for Phone Call Additional follow up Details #2::    Pt advised to stop symbicort. Carron Curie CMA  March 16, 2010 5:01 PM

## 2010-05-19 NOTE — Assessment & Plan Note (Signed)
Summary: Pulmonary Consultation/ inpt - see dictated note   Copy to:  Raechel Chute Primary Provider/Referring Provider:  Raechel Chute   History of Present Illness: 77 yowm remote smoker with GOLD III COPD not baseline 02 dep  May 12, 2010 see consultation re post op resp failure, abn ct chest at [facility] after multiple injury  Medications Prior to Update: 1)  Aspirin Ec Low Dose 81 Mg Tbec (Aspirin) .... Take 1 Tablet By Mouth Once A Day 2)  Pravastatin Sodium 40 Mg Tabs (Pravastatin Sodium) .Marland Kitchen.. 1 Once Daily 3)  Carvedilol 12.5 Mg Tabs (Carvedilol) .Marland Kitchen.. 1 Two Times A Day 4)  Nifedipine 60 Mg Xr24h-Tab (Nifedipine) .Marland Kitchen.. 1 Once Daily 5)  Levothroid 75 Mcg Tabs (Levothyroxine Sodium) .... Take 1 Tablet By Mouth Once A Day 6)  Bupropion Hcl 200 Mg Xr12h-Tab (Bupropion Hcl) .Marland Kitchen.. 1 Once Daily 7)  Clonazepam 0.5 Mg Tabs (Clonazepam) .Marland Kitchen.. 1 Once Daily As Needed 8)  Symbicort 160-4.5 Mcg/act  Aero (Budesonide-Formoterol Fumarate) .... Two Puffs Twice Daily 9)  Spiriva Handihaler 18 Mcg  Caps (Tiotropium Bromide Monohydrate) .... One Puff in Handihaler Daily 10)  Proair Hfa 108 (90 Base) Mcg/act  Aers (Albuterol Sulfate) .... 2 Puffs Every 4-6 Hours As Needed  Allergies: 1)  ! 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

## 2010-05-22 NOTE — Discharge Summary (Signed)
[name], [name]               ACCOUNT NO.:  [id number]  MEDICAL RECORD NO.:  [id number]          PATIENT TYPE:  INP  LOCATION:  1614                         FACILITY:  [facility]  PHYSICIAN:  Conley Canal, MD      DATE OF BIRTH:  Dec 31, 1940  DATE OF ADMISSION:  05/06/2010 DATE OF DISCHARGE:  05/11/2010                        DISCHARGE SUMMARY - REFERRING   ANTICIPATED DATE OF DISCHARGE:  May 11, 2010.  PRIMARY CARE PHYSICIAN:  Dr. Charlesetta Shanks  CONSULTING PHYSICIANS:  Almedia Balls. Ranell Patrick, M.D., Orthopedics.  DISCHARGE DIAGNOSES: 1. Status post fall with right shoulder comminuted and displaced     proximal humerus fracture and right impacted valgus stable femoral     neck fracture status post right femoral neck open reduction and     internal fixation, status post right shoulder reverse total     shoulder arthroplasty. 2. Proteus mirabilis urinary tract infection. 3. Community-acquired pneumonia. 4. Stroke. 5. Acute exacerbation of chronic obstructive pulmonary disease. 6. Leukocytosis related to Proteus mirabilis urinary tract infection. 7. Acute blood loss anemia requiring packed red blood cell     transfusion. 8. Depression. 9. Hyperlipidemia. 10.Hypothyroidism. 11.Essential hypertension.  DISCHARGE MEDICATIONS ANTICIPATED: 1. Aspirin 81 mg daily. 2. Symbicort 2 puff inhalation b.i.d. 3. Wellbutrin 200 mg p.o. daily. 4. Coreg 12.5 mg p.o. b.i.d. 5. Klonopin 0.5 mg daily. 6. Levaquin 500 mg daily x5 more days. 7. Synthroid 75 mcg daily. 8. Multivitamins 1 tablet daily. 9. Procardia 60 mg daily. 10.Pravachol 40 mg daily. 11.Spiriva 18 mcg inhalation daily. 12.Robaxin 500 mg p.o. q.6 hourly p.r.n. 13.Percocet 5/325 mg q.4 hourly p.r.n. 14.Senokot 2 tablets p.o. b.i.d. as needed.  PROCEDURES PERFORMED: 1. Status post open reduction and internal fixation of right femoral     neck 2. Status post right shoulder reverse total shoulder arthroplasty. 3. Chest x-ray,  May 08, 2010, showed bronchitic change, minimal     atelectasis. 4. Right shoulder x-ray, May 06, 2010, showed dislocation of the     humeral head with respect the glenoid and markedly displaced     fracture at the base of the humeral head. 5. Tibia fibular x-ray on the right side was normal. 6. Right hip x-ray on May 14, 2010, showed subcapital slightly     impacted fracture of the right femoral neck.  HOSPITAL COURSE:  This extremely pleasant 70 year old female was admitted on May 06, 2010, after she fell resulting in right humeral and right femoral fractures as described above.  Apparently, the fall was mechanical.  Since admission, the patient has also been found to have Proteus mirabilis urinary tract infection and possible early pneumonia as her white count at admission was 16109 with no clear explanation.  The patient underwent surgical repair by Dr. Patsy Lager as described above and she was also started on Levaquin to cover for lower respiratory tract and urinary tract infection.  Urine cultureon May 07, 2010, grew Proteus mirabilis resistant to ampicillin, cefazolin, ceftriaxone, otherwise sensitive to ciprofloxacin, gentamicin, Levaquin, tobramycin, Bactrim and also resistant to nitrofurantoin.  Blood cultures on May 08, 2010, were negative.  She has responded to the antibiotics with improvement in  the white count from 24,000 on May 06, 2010, to 18,000 today.  The patient was given a short course of systemic steroids for COPD and she has responded well to the steroids.  I do not expect discharge on steroids.  The hospitalization was also punctuated by acute blood loss anemia with lowest hemoglobin 7.9 on May 10, 2010.  The patient is to receive 2 units PRBC.  If she does well posttransfusion, plans would be for possible discharge to skilled nursing facility on May 11, 2010. Orthopedics involvement greatly appreciated.  Otherwise, today,  the patient states she feels much better.  DISCHARGE PHYSICAL EXAMINATION:  VITAL SIGNS:  Vitals are stable with blood pressure of 108/63, heart rate is 84, temperature 97.5, respirations 14, oxygen saturation is 93% on 4 L nasal cannula. HEENT:  Head, Ears, Nose and Throat Examination:  There is no jugular venous distention. RESPIRATORY SYSTEM:  Bilateral air entry with no rhonchi, rales or wheezes. CARDIOVASCULAR SYSTEM:  First and second heart sounds heard.  No murmurs.  Pulse regular. ABDOMEN:  Soft, nontender.  No palpable organomegaly.  Bowel sounds are normal. CNS:  The patient is alert and oriented to person, place and time with no acute focal neurological deficits. EXTREMITIES:  Slight swelling of right arm.  No pedal edema.  Peripheral pulses equal.  LABORATORY DATA:  Labs reviewed, significant for WBC 18.1, hemoglobin 7.9, hematocrit 23.5, platelet count 235.  Sodium 138, potassium 4.2, BUN 11, creatinine 0.88.  PLAN:  The plan for today: 1. Acute blood loss anemia.  To be transfused 2 units PRBC.  With this     degree of anemia, the patient not a good candidate for     anticoagulation. 2. Proteus mirabilis UTI, acute exacerbation of COPD, stroke,     pneumonia.  The patient to complete 10-day course of antibiotics,     to be discharged on Levaquin p.o. 3. Hypertension.  Blood pressure well controlled.  Will continue     Coreg, Procardia. 4. Hypothyroidism.  Continue Synthroid. 5. Hyperlipidemia.  The patient on pravastatin, continue the same. 6. GI prophylaxis.  Protonix.     Conley Canal, MD     SR/MEDQ  D:  05/10/2010  T:  05/10/2010  Job:  [id number]  cc:   [place] Ranell Patrick, M.D. Fax: [phone]  Electronically Signed by Conley Canal  on 05/22/2010 09:28:32 PM

## 2010-05-26 NOTE — Discharge Summary (Signed)
[name], [name]               ACCOUNT NO.:  [id number]  MEDICAL RECORD NO.:  [id number]          PATIENT TYPE:  INP  LOCATION:  1614                         FACILITY:  [facility]  PHYSICIAN:  Calvert Cantor, M.D.     DATE OF BIRTH:  [birth year]  DATE OF ADMISSION:  05/06/2010 DATE OF DISCHARGE:  05/16/2010                              DISCHARGE SUMMARY   PRIMARY CARE PHYSICIAN:  Dr. Charlesetta Shanks.  PRESENTING COMPLAINT:  Fall with injury to the right shoulder.  DISCHARGE DIAGNOSES: 1. Right shoulder comminuted and displaced proximal humeral fractures     and right impacted valgus stable femoral neck fracture status post     open reduction and internal fixation and reverse total shoulder     arthroplasty. 2. Acute blood loss anemia with bleeding into the entire right arm     with significant amount of swelling.  Status post 2 units of packed     red blood cell transfusion. 3. Proteus urinary tract infection treated with Levaquin. 4. Cellulitis of the right knee status post fall and trauma, being     treated currently with doxycycline. 5. Chronic small multiple pulmonary emboli.  No treatment is     recommended at this point.  Pulmonary input was requested on this. 6. Hypoxemia, multifactorial secondary to deconditioning, pleural     effusions and chronic pulmonary embolism. 7. Chronic obstructive pulmonary disease, stable. 8. Anemia due to iron deficiency. 9. Anemia due to B12 deficiency. 10.Hypothyroidism. 11.Constipation, now resolved.  DISCHARGE MEDICATIONS: 1. Albuterol nebulizer solution 2.5 mg every 4 hours as needed for     wheezing or shortness of breath. 2. Cyanocobalamin 1000 mcg tablet p.o. daily.  She has also received a     subcutaneous injection of cyanocobalamin on May 15, 2010.  This     can be chosen to be given monthly. 3. Doxycycline 100 mg b.i.d. for 7 more days. 4. Lovenox or enoxaparin 40 mg subcutaneous daily for 7 days.  This     should be  continued if the patient is not ambulatory within 7 days. 5. Ferrous sulfate 325 mg by mouth b.i.d. with meals. 6. Methocarbamol 500 mg every 6 hours as needed for muscle spasms. 7. Triple antibiotic ointment 1 application to right knee if skin     breaks are noted, apply daily as needed. 8. Oxycodone/acetaminophen 1-2 tablets every 4 hours as needed for     moderate to severe pain. 9. Polyethylene glycol 17 grams daily as needed for constipation. 10.Senna 2 tablets twice a day, hold if patient is having diarrhea. 11.Aspirin 81 mg daily. 12.Bupropion on SR 200 mg daily. 13.Carvedilol 12.5 mg twice a day, hold if heart rate is less than 60     or systolic blood pressure is less than 100. 14.Clonazepam 0.5 mg daily at bedtime as needed. 15.Levothyroxine 75 mcg by mouth daily. 16.Therapeutic multivitamins 1 tablet daily. 17.Nifedipine XR 60 mg daily. 18.Pravachol 40 mg daily. 19.Spiriva 18 mcg 1 puff daily. 20.Symbicort 160/4.5, 2 puffs twice a day. 21.Vitamin C 500 mg 2 tablets daily.  HOSPITAL COURSE: 1. Comminuted right shoulder fracture, displaced proximal humeral  fracture, nondisplaced humeral neck fracture.  The patient     underwent surgery on January 21.  This was performed by Dr. Ranell Patrick.     She is now undergoing physical therapy.  Incision appears intact. 2. Anemia.  This was secondary to acute blood loss.  Per operative     report, estimated blood loss during the surgery was 200 mL.     However, it was noted that her right arm became significantly     swollen with extensive ecchymosis throughout the arm down to her     hand.  It was thought that she had lost most of her blood in the     arm.  By the time it was discovered, it seemed that it had abated.     The patient did lose about 2-3 grams of hemoglobin and therefore     was given 2 units of packed red blood cells.  Hemoglobin on     admission, January 20, was 13.4.  It dropped to 7.9 by January 24     and status  post transfusion came back up to 11.4 that same day. 3. Iron and B12 deficiency.  The patient was noted to have deficiency     of both iron and B12.  She received a B12 injection.  Iron infusion     was recommended, but she declined.  She is currently on oral     medication for both.  Please follow her levels on outpatient basis. 4. Proteus UTI.  This was sensitive to Levaquin and has been treated. 5. Cellulitis above the right knee.  The patient injured this knee     during her fall and has an abrasion, now scabbed over.  There is     significant amount of erythema surrounding the abrasion with some     mild swelling and possibly an underlying effusion or hematoma.  For     now, we will treat with doxycycline p.o.  Please monitor her WBC     count.  Currently it is 17.5.  It was 20 two days ago. 6. Acute hypoxia secondary to atelectasis and deconditioning, chronic     pulmonary emboli and severe COPD.  The patient was noted to become     hypoxic once her oxygen was removed.  CT of her chest was ordered.     She was found to have bilateral nonocclusive filling defects in the     subsegmental pulmonary arteries without any evidence of right heart     strain.  This was discussed with the pulmonary group and these PEs     were thought to be chronic and no anticoagulation was recommended     other than DVT prophylaxis.  CT also revealed moderate to severe     underlying emphysema and a 3-mm pulmonary nodule in the left lung.     Followup of the nodule is recommended. 7. Proteus UTI, treated with a course of Levaquin. 8. Cellulitis of right knee being treated with doxycycline.  Please     monitor carefully.  DISPOSITION:  The patient is being discharged to a rehabilitation facility.  DISCHARGE INSTRUCTIONS: 1. Check CBC in 3 days to ensure that WBC count is improving. 2. Monitor right knee cellulitis closely. 3. Follow up iron and B12 levels in 1-2 months. 4. Monitor blood pressure daily  and adjust blood pressure medications     accordingly. 5. Monitor oxygen levels daily and give the patient O2 if pulse  oximetry is below 90%.  FOLLOWUP INSTRUCTIONS:  She is to follow up with her PCP, Dr. Celene Skeen, in 2 weeks' time.  CONDITION ON DISCHARGE:  Stable.  Time on discharge today was 60 minutes.     Calvert Cantor, M.D.     SR/MEDQ  D:  05/16/2010  T:  05/16/2010  Job:  [id number]  cc:   Charlesetta Shanks  Electronically Signed by Calvert Cantor M.D. on 05/26/2010 12:29:26 PM

## 2010-05-27 ENCOUNTER — Encounter: Payer: Self-pay | Admitting: Pulmonary Disease

## 2010-05-27 ENCOUNTER — Ambulatory Visit (INDEPENDENT_AMBULATORY_CARE_PROVIDER_SITE_OTHER): Payer: Medicare Other | Admitting: Pulmonary Disease

## 2010-05-27 ENCOUNTER — Ambulatory Visit (INDEPENDENT_AMBULATORY_CARE_PROVIDER_SITE_OTHER)
Admission: RE | Admit: 2010-05-27 | Discharge: 2010-05-27 | Disposition: A | Discharge status: Home / Self Care | Payer: Medicare Other | Source: Ambulatory Visit | Attending: Pulmonary Disease | Admitting: Pulmonary Disease

## 2010-05-27 ENCOUNTER — Other Ambulatory Visit: Payer: Self-pay | Admitting: Pulmonary Disease

## 2010-05-27 DIAGNOSIS — I2699 Other pulmonary embolism without acute cor pulmonale: Secondary | ICD-10-CM

## 2010-05-27 DIAGNOSIS — J438 Other emphysema: Secondary | ICD-10-CM

## 2010-05-31 NOTE — Op Note (Signed)
NAMEDALLYS, NOWAKOWSKI               ACCOUNT NO.:  [id number]  MEDICAL RECORD NO.:  [id number]          PATIENT TYPE:  INP  LOCATION:  1614                         FACILITY:  [facility]  PHYSICIAN:  Almedia Balls. Ranell Patrick, M.D. DATE OF BIRTH:  Sep 07, 1940  DATE OF PROCEDURE:  05/07/2010 DATE OF DISCHARGE:                              OPERATIVE REPORT   PREOPERATIVE DIAGNOSES: 1. Right shoulder comminuted and displaced proximal humerus fracture. 2. Right impacted valgus stable femoral neck fracture.  POSTOPERATIVE DIAGNOSES: 1. Right shoulder comminuted and displaced proximal humerus fracture. 2. Right impacted valgus stable femoral neck fracture.  PROCEDURES PERFORMED: 1. Right femoral neck ORIF using 6.5 cannulated screws. 2. Right shoulder reverse total shoulder arthroplasty.  ATTENDING SURGEON:  Almedia Balls. Ranell Patrick, M.D.  ASSISTANT:  Konrad Felix. Dixon, PA-C.  ANESTHESIA:  General anesthesia was used.  ESTIMATED BLOOD LOSS:  200 mL.  FLUID REPLACEMENT:  1300 mL crystalloid.  URINE OUTPUT:  600 mL.  COUNTS:  Instrument count was correct.  COMPLICATIONS:  There were no complications.  PERIOPERATIVE ANTIBIOTICS:  Perioperative antibiotics were given.  INDICATIONS:  Patient is a 70 year old female, who presents with a history of fall onto her right side.  The patient first complained of a medial right shoulder and right hip pain and was unable to stand after her injury.  She presented to the emergency department where x-rays were obtained demonstrating a comminuted and extremely displaced proximal humerus fracture with about 200%-300% displacement of the humeral shaft medially and also she has a stable valgus impacted femoral neck fracture.  The patient was admitted by the Internal Medicine Service and was cleared medically prior to orthopedic consultation.  The patient now is brought to the operating theater to restore her humeral anatomy to allow for shoulder function and also to  stabilize her hip fracture. Discussed with the patient and her family options for treatment for the shoulder to include hemiarthroplasty versus total shoulder arthroplasty using a reverse total shoulder prosthesis.  The advantage of the reverse total shoulder would allow for a immediate weightbearing and immediate active use of the arm versus the shoulder hemiarthroplasty for fracture, which we typically have to really restrict activity of that right arm for about 10-12 weeks during the healing of the tuberosities.  Given the patient's COPD and chronic steroid use low demand, we decided that the reverse total shoulder make the most sense to allow for early mobilization specially given the ipsilateral hip and shoulder fracture. The patient's hip fracture was such that we could proceed with cannulated screws to stabilize the valgus impacted fracture.  I did range her hip in the bed and really she did not have much pain with that hip fracture demonstrating a fairly stable pattern.  Patient is brought now for surgery.  Informed consent was obtained.  DESCRIPTION OF PROCEDURE:  After adequate level of anesthesia was achieved, the patient was positioned supine on the fracture table. Peroneal post utilized, right foot placed in a well-padded traction boot and really no traction placed on the leg.  It was just held in site. The left leg was placed in modified lithotomy position and padded  appropriately.  Pulses were checked and were normal.  The upper extremities were padded and secured.  After sterile prep and drape of the right hip and leg, we placed a shower curtain.  We then went ahead and used the C-arm and was draped in the field to fluoroscopically guide [place] during the percutaneous cannulated screw placement.  Through a lateral approach, we placed three 6.5 cannulated screws from the lateral femur across the fracture site into the femoral head getting good purchase and compression.   Following these three screws placement, we thoroughly irrigated the wound and then closed the wound with interrupted nylon suture.  A sterile compressive bandage was applied. The patient was then taken off of the fracture bed and split onto the bed with the shoulder holder on it and then was positioned in the modified beach-chair position.  This was a separate prep and drape and a separate table we had to move her onto and this was done with her asleep.  When she was in the modified beach-chair position and all neurovascular structures padded appropriately, we made a deltopectoral incision starting at the coracoid process extending down to the anterior humerus.  Dissection down through the subcutaneous tissues using Bovie. The conjoined tendon taken medially and the deltoid taken laterally and the pectoralis was taken medially were identified pretty quickly.  The patient's humerus was incarcerated medially and was really quite incarcerated, not able to be moved.  We noted that the humerus actually was underneath the conjoined tendon and actually underneath the brachial plexus.  We started with really basically just removing the humeral head.  We found the biceps tendon and the lesser tuberosity and osteotomized lesser tuberosity off and then basically that gave [place] full access to the anterior shoulder.  We then removed the head and took the rotator cuff off sharply with the Bovie and once we did that, that gave [place] a lot more room and we were able to see where the humerus was up underneath the brachial plexus.  I used my fingers and gently retracted the brachial plexus anteriorly and then pulled on the humerus with my other finger down in the humeral shaft and was able to pull that free without doing any injury to the nerves or blood vessels in the area. Once that was done, we were able to translate the humerus posteriorly and secured 360 degrees access to the glenoid face.  We removed  the biceps tendon off the superior labrum and removed the labrum in a 360- degree manner and dissected down the scapular neck on all sides.  We also resected the remaining rotator cuff tissue and had good look at the face.  She had extremely small glenoid.  We went ahead and placed a guidepin in the central portion of glenoid and then reamed using the medical-in reamer down to subchondral bone.  We placed the medical-in the best position possible, still overhung anteriorly, impacted that into place.  This was from the [employer] Delta extend system.  We next went ahead and drilled for the inferior and superior locking screws.  We were able to get a 42 screw inferiorly and then a 36 up into the coracoid base with good purchase.  We locked those into position.  The posterior screw was only an 18 lock screw.  We then placed a 38 standard glenosphere into position and screwed that into place.  We made sure that it was free of all soft tissue.  We then delivered the humerus out  of the wound and we were able to prepare that up to a size 10 for reaming and then selected the 10/01 right humerus and placed that in the position with the 10 degrees of retroversion and took a 38 +3 trial poly insert and placed that on the top of the humeral implant.  I was able to hold that humeral implant with appropriate eversion and reduced the shoulder and noted there to be excellent soft tissue balance and good coverage.  We then dislocated that to the trial implant and removed the humeral stem.  I then went ahead and placed a cement restrictor distal to the stem.  This was a size 3 and then pulse irrigated the canal and then cemented the humeral stem into place.  This is a DePuy cemented Delta extend stem, size 10, one right implant.  We placed the skin in exactly the same height, which basically the implant came down to rest on the medial bone that was still left and we had the version set at 10 degrees of  retroversion and we held that until the cement was hardened and then checked for any excessive cement and none was encountered.  We then went ahead and trialed again the 38 +3 poly and that was exactly the right tension with the conjoined tendon under perfect tension and negative sulcus and inability with basically no gapping with external rotation and good mobility, no impingement at all, so we traded out the trial +3, 38 with the real implant and placed that into place and impacted that to lock the poly into the humeral implant and reduced the shoulder, again happy with the balance and stability for the shoulder. We thoroughly irrigated up onto the deltoid.  We then resected the remaining subscapularis tendon, careful to check the axillary nerve, which was still intact and free and not under undue tension and also palpated the underside of the brachial plexus, which was in good shape. After the thorough irrigation, we closed the wound.  Deltopectoral portion was closed with 0 Vicryl suture followed by 2-0 Vicryl subcutaneous closure and running 4-0 Monocryl stitch, Steri-Strips and sterile compressive bandage and then was placed in a shoulder sling and transferred from the operating room table to the recovery room stretcher in stable condition.     Almedia Balls. Ranell Patrick, M.D.     SRN/MEDQ  D:  05/07/2010  T:  05/07/2010  Job:  [id number]  Electronically Signed by Malon Kindle  on 05/31/2010 11:28:44 AM

## 2010-06-14 NOTE — Assessment & Plan Note (Signed)
Summary: posthospital followup....copd/pe   Copy to:  [name] Primary Provider/Referring Provider:  Raechel Long  CC:  Post hosp f/u appt.  Pt is currently at [facility]  Pt states she is feeling better since hosp stay.  Pt denied sob or cough. .  History of Present Illness: the pt comes in today for f/u after a recent hospitalization for shoulder fracture with blood loss anemia and multiple other medical issues.  She has known severe emphysema, and surprisingly her breathing was stable throughout her hospitalization.  She did have a ct during her hospitalization that showed very small DISTAL PE's that appeared to be chronic, and had normal venous doppler studies.  The decision was made to not anticoagulate her due to very high fall risk, nor to put in IVC filter given her normal dopplers and stable clinical status (it is understood she may have pelvic clot).  Again, this is felt to represent chronic clot.  The pt currently is in a rehab facility, and is much improved.  She denies breathing issues, cough, or congestion.    Current Medications (verified): 1)  Aspirin Ec Low Dose 81 Mg Tbec (Aspirin) .... Take 1 Tablet By Mouth Once A Day 2)  Bupropion Hcl 200 Mg Xr12h-Tab (Bupropion Hcl) .Marland Kitchen.. 1 Once Daily 3)  Carvedilol 12.5 Mg Tabs (Carvedilol) .Marland Kitchen.. 1 Two Times A Day 4)  Levothroid 75 Mcg Tabs (Levothyroxine Sodium) .... Take 1 Tablet By Mouth Once A Day 5)  Nifedipine 60 Mg Xr24h-Tab (Nifedipine) .Marland Kitchen.. 1 Once Daily 6)  Pravastatin Sodium 40 Mg Tabs (Pravastatin Sodium) .Marland Kitchen.. 1 Once Daily 7)  Spiriva Handihaler 18 Mcg  Caps (Tiotropium Bromide Monohydrate) .... One Puff in Handihaler Daily 8)  Symbicort 160-4.5 Mcg/act  Aero (Budesonide-Formoterol Fumarate) .... Two Puffs Twice Daily 9)  Clonazepam 0.5 Mg Tabs (Clonazepam) .Marland Kitchen.. 1 Once Daily As Needed 10)  Proair Hfa 108 (90 Base) Mcg/act  Aers (Albuterol Sulfate) .... 2 Puffs Every 4-6 Hours As  Needed  Allergies (verified): 1)  ! Pcn  Past History:  Past medical, surgical, family and social histories (including risk factors) reviewed, and no changes noted (except as noted below).  Past Medical History: Reviewed history from 05/12/2010 and no changes required. 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 Hysterectomy 1975  Family History: Reviewed history from 02/02/2009 and no changes required. heart disease; mother ( mix 2 ) father clotting disorders: mother (stroke x 2)   Social History: Reviewed history from 02/02/2009 and no changes required. ETOH- 3 times per wk Patient states former smoker.  started at age 60.  1 ppd . quit 2000. pt is divorced. pt has children. 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  Review of Systems       The patient complains of hand/feet swelling.  The patient denies shortness of breath with activity, shortness of breath at rest, productive cough, non-productive cough, coughing up blood, chest pain, irregular heartbeats, acid heartburn, indigestion, loss of appetite, weight change, abdominal pain, difficulty swallowing, sore throat, tooth/dental problems, headaches, nasal congestion/difficulty breathing through nose, sneezing, itching, ear ache, anxiety, depression, joint stiffness or pain,  rash, change in color of mucus, and fever.    Vital Signs:  Patient profile:   70 year old female Height:      63 inches O2 Sat:      95 % on Room air Temp:     98.1 degrees F oral Pulse rate:   88 / minute BP sitting:   112 / 58  (left arm) Cuff size:   regular  Vitals Entered  By: Arman Filter LPN (May 27, 2010 1:47 PM)  O2 Flow:  Room air CC: Post hosp f/u appt.  Pt is currently at [facility].  Pt states she is feeling better since hosp stay.  Pt denied sob or cough.  Comments Unable to weigh pt.  Wheelchair bound. Medications reviewed with patient Arman Filter LPN  May 27, 2010 1:47 PM    Physical Exam  General:  frail female in nad Nose:  no purulence or discharge Lungs:  mild decreased bs, no wheezing or rhonchi Heart:  rrr, no mrg Abdomen:  benign  Extremities:  mild edema, no cyanosis  Neurologic:  alert and oriented, moves all 4.   Impression & Recommendations:  Problem # 1:  EMPHYSEMA, SEVERE (ICD-492.8) the pt is doing surprisingly well fromn a pulmonary standpoint.  She has no increased wob, excellent sats, and is participating in rehab.  I have asked her to continue on her current meds, and will check a f/u cxr today.  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  She is a very high anticoagulation risk, and would also like to avoid IVC filter placement.  I have discussed all of this with her and family, and they agree.  We will have to keep this in mind, and monitor her for symptoms of DVT/PE in the future.    Medications Added to Medication List This Visit: 1)  Clonazepam 0.5 Mg Tabs (Clonazepam) .... Take 1 tab by mouth at bedtime as needed 2)  Cyanocobalamin 1000 Mcg/ml Soln (Cyanocobalamin) .... Injection once a month 3)  Ferrous Sulfate 325 (65 Fe) Mg Tabs (Ferrous sulfate) .... Take 1 tablet by mouth two times a day 4)  Senokot S 8.6-50 Mg Tabs (Sennosides-docusate sodium) .... Take 1 tablet by mouth two times a day 5)  Vitamin C 500 Mg Tabs (Ascorbic acid) .... Take 2 tabs by mouth daily 6)  Albuterol Sulfate (2.5 Mg/97ml) 0.083% Nebu (Albuterol sulfate) .Marland Kitchen.. 1 vial in nebulizer every 4 hours as  needed 7)  Robaxin 500 Mg Tabs (Methocarbamol) .... Take 1 tab by mouth every 6 hours as needed 8)  Percocet 5-325 Mg Tabs (Oxycodone-acetaminophen) .Marland Kitchen.. 1 to 2  tab by mouth every 4 hours as needed 9)  Miralax Powd (Polyethylene glycol 3350) .... As needed  Other Orders: Est. Patient Level IV (99214) T-2 View CXR (71020TC)  Patient Instructions: 1)  continue pulmonary medications 2)  try and stay as active as possible 3)  will check cxr today, and will call you with the results. 4)  followup with me in 4mos.

## 2010-06-23 NOTE — Progress Notes (Signed)
[name], [name]               ACCOUNT NO.:  [id number]  MEDICAL RECORD NO.:  [id number]          PATIENT TYPE:  INP  LOCATION:  1614                         [facility]  PHYSICIAN:  Altha Harm, MDDATE OF BIRTH:  12-04-1940                                PROGRESS NOTE   CURRENT DIAGNOSES: Include the following: 1. Status post fall with right shoulder comminuted and displaced     proximal humerus fracture and right impacted valve a stable femoral     neck fracture.  The patient is status post right femoral neck open     reduction internal fixation and right shoulder reversed total     arthroplasty. 2. Right upper extremity hematoma. 3. Acute blood loss anemia, secondary to right upper extremity     hematoma, status post transfusion of 2 units of packed red blood     cells. 4. Proteus urinary tract infection.  The patient to complete     antibiotics on May 15, 2010. 5. Cellulitis of the right knee.  The patient currently on doxycycline     day #2. 6. Chronic small pulmonary emboli.  Pulmonary recommends only     prophylaxis for deep venous thrombosis. 7. Hypoxemia, felt to be multifactorial. 8. Chronic obstructive pulmonary disease, stable. 9. Hypothyroidism. 10.Iron deficiency anemia. 11.Constipation, resolved. 12.Persistent leukocytosis, cause unclear.  MEDICATIONS: To be dictated at the time of discharge by the discharging physician.  CONSULTANTS: 1. Almedia Balls. Ranell Patrick, M.D., Orthopedic Surgery. 2. Charlaine Dalton. Sherene Sires, MD, FCCP, Pulmonology.  PRIMARY CARE PHYSICIAN: Dr. Charlesetta Shanks, with [facility].  Fax number [phone], phone number [phone].  CODE STATUS: Full code.  ALLERGIES: PENICILLIN AND ORAL CONTRAST BUT NOT IV CONTRAST.  PROCEDURES: 1. Right femoral neck ORIF using 6.5 cannulated screw. 2. Right shoulder reversed total shoulder arthroplasty.  DIAGNOSTIC STUDIES: 1. CT angiogram of the  chest which shows nonocclusive filling defects     in the subsegmental pulmonary arteries to the left lower lobe,     lingula, and right upper lobe.  No evidence of right heart strain.     Moderate to severe underlying emphysema.  Pulmonary nodule detailed     over the largest measuring 3.0 mm.  If the patient is at high risk     for bronchogenic carcinoma, followup CT scan in 1 year is     recommended. 2. Two-view abdomen which shows no acute finding 3. Two-view chest which shows small bilateral pleural effusions and     COPD. 4. Doppler ultrasound of the right upper extremities which shows no     evidence of DVT or superficial thrombus involving the right upper     extremity or left subclavian. 5. Bilateral lower extremity venous duplex which shows no evidence of     DVT or superficial thrombus involving the right lower extremity and     left lower extremity.  No evidence of Baker's cyst.  CHIEF COMPLAINT: History of fall with injury to right shoulder and right hip.  HISTORY OF PRESENT ILLNESS: Please refer to the H and P by Dr. Abram Sander for details of the HPI.  HOSPITAL COURSE: 1. Fracture of the right shoulder and right hip.  The patient was seen     by Dr. Ranell Patrick and taken to Surgery for procedures as noted above.     The patient had a difficult recovery course with hematoma formed in     the right upper extremity and acute blood loss anemia.  Presently,     the patient is working with physical therapy with toe touch     weightbearing to the left lower extremity.  Per Orthopedic, it is     okay to weight bear on the right arm. 2. Right upper extremity hematoma with acute blood loss anemia.  The     patient has a normal hemoglobin of 14.  Upon arrival to the     hospital, she had hemoglobin of 13.2 which decreased to 7.9.  This     blood loss anemia was felt to be secondary to the hematoma of the     right upper extremity.  The patient received a transfusion of 2     units of  packed red blood cells. 3. Hypoxemia.  The patient was persistently hypoxic.  Previous to     coming to the hospital, the patient had chronic O2 requirement.     She is normally seen by Dr. Odessa Fleming in the office, and per office     notes did not have an ongoing O2 requirement.  While here in the     hospital, the patient had O2 requirement of 4 liters.     Investigations were done including a workup for pulmonary embolus.     The patient was found to have filling defects bilaterally, although     it was unclear as to whether or not these represented new pulmonary     emboli or chronic pulmonary emboli.  Pulmonology was consulted and     they felt that these were chronic pulmonary emboli and did not     recommend any full anticoagulation at this time.  They did     recommend that the patient should have prophylaxis of DVTs while     undergoing her therapy.  The patient has been started on Lovenox     prophylaxis. 4. Leukocytosis.  The patient has had a persistent leukocytosis and     the etiology is unclear.  The patient had several infections     including a Proteus urinary tract infection and most recently a     cellulitis of the right knee.  However, despite treatment, the     patient's leukocytosis persist.  The patient had been on prednisone     for her COPD, and this certainly could contribute to her     leukocytosis.  However, at this time, the prednisone and has been     discontinued and the leukocytosis still persist.  The patient has     had no diarrhea.  This is currently still being worked up. 5. Proteus urinary tract infection.  The patient will complete her     therapy for Proteus UTI on May 15, 2010.  She has had no     further the urinary symptoms. 6. Right knee cellulitis.  The patient developed a right knee     cellulitis in the area where she fell initially.  The patient has     been started on doxycycline 100 mg p.o. b.i.d. and should complete     a total of 7-10  days of doxycycline for her cellulitis.  7. COPD.  The patient is stable.  The patient has had no further     wheezing.  She had been on prednisone which has been tapered off. 8. Constipation.  This is now resolved and the patient is on MiraLax     daily as needed. 9. Hypothyroidism.  This is stable and the patient is receiving her     Synthroid as usual. 10.Iron deficiency anemia.  The patient was found to have iron level     of less than 10.  I have ordered for the patient to have IV iron.     This brings [place] up-to-date on the patient's clinical course.     Altha Harm, MD     MAM/MEDQ  D:  05/15/2010  T:  05/15/2010  Job:  [id number]  Electronically Signed by Marthann Schiller MD on 06/22/2010 10:09:42 PM

## 2010-08-19 ENCOUNTER — Telehealth: Payer: Self-pay | Admitting: Pulmonary Disease

## 2010-08-19 MED ORDER — NYSTATIN 100000 UNIT/ML MT SUSP: OROMUCOSAL | Status: DC

## 2010-08-19 NOTE — Telephone Encounter (Signed)
Ok to call in nystatin 400,000 IU tid for 5 days QS, no fills.

## 2010-08-19 NOTE — Telephone Encounter (Signed)
Spoke w/ pt and she is aware rx was sent to pharmacy. Pt had no questions on directions and needed nothing else further

## 2010-08-19 NOTE — Telephone Encounter (Signed)
Spoke with pt.  She states has white film in mouth, is pretty sure that she has thrush b/c she has had this before. She states that she has been brushing tongue and gargling as usual, but is not able to do this as well since she had fall and can not use her right hand when she brushes her teeth.  Would like med called in for this.  Pls advise thanks!

## 2010-09-02 ENCOUNTER — Other Ambulatory Visit: Payer: Self-pay | Admitting: Pulmonary Disease

## 2010-09-05 ENCOUNTER — Telehealth: Payer: Self-pay | Admitting: Pulmonary Disease

## 2010-09-05 MED ORDER — NYSTATIN 100000 UNIT/ML MT SUSP: OROMUCOSAL | Status: DC

## 2010-09-05 NOTE — Telephone Encounter (Signed)
Spoke with patient and looking back she has called several times about thrush in the past. She states while she was in the hospital she was using nystatin and also was using a cream and powder because she had yeast on her skin as well. She states her mouth did improve some, but it has now come back. She is c/o having white patches in her mouth and throat that are sore. Pt is requesting a refill on nystatin. Per last phone note in [facility] mentioned stopping symbicort if thrush returned. Please advise. Carron Curie, CMA Allergies  Allergen Reactions  . Penicillins     REACTION: hives

## 2010-09-05 NOTE — Telephone Encounter (Signed)
Called and spoke with pt. Informed her of [facility]'s recs.  Rx sent to pharmacy for Nystatin.  Pt will come by the office and pick up sample of Dulera 100/5 to try.

## 2010-09-05 NOTE — Telephone Encounter (Signed)
Ok to call in nystatin 400,000 IU swish and swallow tid for 5 days, QS, no fills I think she needs to come off symbicort.  Would like for her to try dulera 100/5  2 inhalations am and pm, rinse mouth well.  Please leave sample for her at desk, and then we can decide if this will do better.

## 2010-09-27 ENCOUNTER — Ambulatory Visit: Payer: Medicare Other | Admitting: Pulmonary Disease

## 2010-09-30 ENCOUNTER — Telehealth: Payer: Self-pay | Admitting: Pulmonary Disease

## 2010-09-30 NOTE — Telephone Encounter (Signed)
1. Pls give her the standard nystatin s and s 5 times daily x 5 days. Then fu with Clance 2. Based on rviewe of chart she is NOT AT RISK FOR thrush in her mouth spreading into blood stream. It can happen very rarely but it is special situations as in a ICU patient after bone marrow transplant as an example. She does not meet criteria for it. She can address with Dr Jonathon Bellows at followup 3. Thursh in mouth DOES NOT cause thyroid or potassium problem

## 2010-09-30 NOTE — Telephone Encounter (Signed)
Spoke with pt and notified of recs per MR.  Pt verbalized understanding. Her med was already sent to pharm.

## 2010-09-30 NOTE — Telephone Encounter (Signed)
Called and spoke with pt. Pt c/o thrush in her mouth and is requesting rx for this.  Pt states there are white pasty patches on back of throat and mouth.  Also states her ears and eyes itch-.  Pt wanted to know if thrush can get into the blood stream and affect other areas of her body? Pt states she had blood work recently and her potassium and thyroid were abnormal and pt wanted to know if the thrush could have caused this.  Per phone note from 5/21 pt was to stop Symbicort and change to [facility].  Pt states she did this but states the [facility] made her breathing worse and therefore pt switched back to the Symbicort, which she is currently taking.  Pt states she is unable to come in for an appt today- cannot drive d/t recent shoulder surgery.  KC out of office today.  Will forward message to doc of the day to address. Allergies: PCN

## 2010-10-01 ENCOUNTER — Other Ambulatory Visit: Payer: Self-pay | Admitting: Pulmonary Disease

## 2010-10-03 ENCOUNTER — Telehealth: Payer: Self-pay | Admitting: Pulmonary Disease

## 2010-10-03 NOTE — Telephone Encounter (Signed)
Called, spoke with [name] at [facility] bc per previous phone message, this was called in today.  Per [name], they did receive the nystatin s & s called in today and it is ready for pt to pick up.    Called, spoke with pt.  Apologized for this and informed her, per [name] at [facility], rx is ready for pick.  She verbalized understanding of this and voiced no further questions at this time.

## 2010-10-03 NOTE — Telephone Encounter (Signed)
I called in nystatin rx. Pt aware.Carron Curie, CMA

## 2010-10-06 ENCOUNTER — Encounter: Payer: Self-pay | Admitting: Pulmonary Disease

## 2010-10-07 ENCOUNTER — Ambulatory Visit (INDEPENDENT_AMBULATORY_CARE_PROVIDER_SITE_OTHER): Payer: Medicare Other | Admitting: Pulmonary Disease

## 2010-10-07 ENCOUNTER — Encounter: Payer: Self-pay | Admitting: Pulmonary Disease

## 2010-10-07 VITALS — BP 132/78 | HR 80 | Temp 98.0°F | Ht 63.0 in | Wt 121.8 lb

## 2010-10-07 DIAGNOSIS — J449 Chronic obstructive pulmonary disease, unspecified: Secondary | ICD-10-CM

## 2010-10-07 NOTE — Progress Notes (Signed)
  Subjective:    Patient ID: [name], female    DOB: [birth year], 70 y.o.   MRN: [id number]  HPI The pt comes in today for f/u of her known emphysema.  She was unable to tolerate dulera, and has gone back to symbicort.  She is working hard to minimize thrush.  She feels her breathing is not at her normal baseline, but within her normal range overall.  She denies chest congestion, purulence, or wheezing.  She needs to get back to her exercises from pulmonary rehab.   Review of Systems  Constitutional: Negative for fever and unexpected weight change.  HENT: Negative for ear pain, nosebleeds, congestion, sore throat, rhinorrhea, sneezing, trouble swallowing, dental problem, postnasal drip and sinus pressure.   Eyes: Positive for redness and itching.  Respiratory: Positive for cough and shortness of breath. Negative for chest tightness and wheezing.   Cardiovascular: Negative for palpitations and leg swelling.  Gastrointestinal: Negative for nausea and vomiting.  Genitourinary: Negative for dysuria.  Musculoskeletal: Negative for joint swelling.  Skin: Positive for rash.  Neurological: Negative for headaches.  Hematological: Bruises/bleeds easily.  Psychiatric/Behavioral: Negative for dysphoric mood. The patient is not nervous/anxious.        Objective:   Physical Exam Thin female in nad Nares patent without discharge, no purulence noted. Chest with decreased bs throughout, no wheezing Cor with rrr LE without edema, no cyanosis  Alert and oriented, moves all 4        Assessment & Plan:

## 2010-10-07 NOTE — Assessment & Plan Note (Signed)
The pt is stable currently, though at the lower range of her usual baseline.  She has not had a chest infection or acute exacerbation since the last visit, and denies congestion currently.  I have asked her to get back to her conditioning program now that her shoulder has healed, and she is to call if her breathing worsens.

## 2010-10-07 NOTE — Patient Instructions (Signed)
Will try symbicort with spacer to see if this reduces incidence of thrush. Stay as active as possible followup with me in 4mos, or sooner if you are having issues.

## 2010-10-25 ENCOUNTER — Telehealth: Payer: Self-pay | Admitting: Pulmonary Disease

## 2010-10-25 NOTE — Telephone Encounter (Signed)
Let her know that if she is continuing to have thrush that is really bothering her, would stop symbicort and see how her breathing does on spiriva alone.   Let us know if not doing well.

## 2010-10-25 NOTE — Telephone Encounter (Signed)
Pt advised. Cleora Karnik, CMA  

## 2010-10-25 NOTE — Telephone Encounter (Signed)
Spoke with the pt and she is c/o even with the spacer she is still having thrush in her mouth. She states she is rinsing her mouth after each use as well as brushing her teeth and tongue after each use, but she has not seen any improvement in the thrush in her mouth. Please advise. Carron Curie, CMA Allergies  Allergen Reactions  . Penicillins     REACTION: hives   [facility] [address]

## 2010-10-31 ENCOUNTER — Telehealth: Payer: Self-pay | Admitting: Pulmonary Disease

## 2010-10-31 MED ORDER — NYSTATIN 100000 UNIT/ML MT SUSP: OROMUCOSAL | Status: DC

## 2010-10-31 NOTE — Telephone Encounter (Signed)
Let her know that she was not able to tolerate any of the meds that we would typically add to spiriva.  We can try foradil one inhalation am and pm #60, no fills if she feels her breathing is not doing as well.  Let her know this does not have an inhaler steroid in it.  Can also call in nystatin suspension, 400,000 IU swish, gargle and spit 3 times a day for 5 days only.  QS, no fills.

## 2010-10-31 NOTE — Telephone Encounter (Signed)
I spoke with the pt and she states she stopped the Symbicort on 10-25-10 but the thrush in her mouth has not gotten any better. Pt requesting rx for this.  She also states since stopping symbicort she has had an increased dry cough, a heaviness in her chest especially on the left side, and pain on the left side when she tries to take a deep breath. She states she doesn't feel like she can take a deep breath like she did when she was on the symbicort. Please advise. Carron Curie, CMA Allergies  Allergen Reactions  . Penicillins     REACTION: hives

## 2010-10-31 NOTE — Telephone Encounter (Signed)
Patient wishes to finish out the month on Spiriva alone to see how her breathing does. She says she will call if she wants or needs the Foradil called in to the pharmacy. Nystatin has been sent to the pharmacy and pt is aware.

## 2010-10-31 NOTE — Telephone Encounter (Signed)
Noted  

## 2010-11-01 NOTE — Telephone Encounter (Signed)
PATIENT WANTS CLARIFICATION REGARDING CHANGE IN MEDICATIONS

## 2010-11-01 NOTE — Telephone Encounter (Signed)
I spoke with the pt and she was confused about instructions given about foradil. I clarified with KC and the foradil is instead of the symbicort, she is to continue the spiriva. Pt states understanding and states she has decided to give it a try. I left a sample at the front for the pt to try the med before we send in a prescription. Pt states understanding. Carron Curie, CMA

## 2010-11-08 ENCOUNTER — Telehealth: Payer: Self-pay | Admitting: Pulmonary Disease

## 2010-11-08 NOTE — Telephone Encounter (Signed)
Spoke with the pt and she states she completed the 5 days of nystatin and the thrush is no better. She states the foradil is working well. I advised the pt since her inhalers have been changed to one that will not cause thrush and she is still having it to make an appt with her PCP to discuss with him so he can see if there is another reason that she is having the thrush. Pt states she has an appt with him this week. Carron Curie, CMA

## 2010-11-18 ENCOUNTER — Telehealth: Payer: Self-pay | Admitting: Pulmonary Disease

## 2010-11-18 MED ORDER — TIOTROPIUM BROMIDE MONOHYDRATE 18 MCG IN CAPS: 18.0000 ug | ORAL_CAPSULE | Freq: Every day | RESPIRATORY_TRACT | Status: DC

## 2010-11-18 NOTE — Telephone Encounter (Signed)
The foradil will not cause thrush.  It is the steroid in symbicort that causes the thrush, not the foradil. Stay on spiriva and foradil and see how she does.

## 2010-11-18 NOTE — Telephone Encounter (Signed)
Called, spoke with pt.  Requesting rx for spiriva - [facility].  Rx sent pt aware.  She also states symbicort was stopped d/t thrush and was given nystatin rx by KC.  However, even after stopping the symbicort and taking the nystatin, the thrush has never went away.  She was given diflucan x 1 dose by PCP and will take another dose on Sunday - states she does get relief "off and on" from this but would like to know if [facility] thinks the foradil could be contributing to this bc this is one of the meds in the symbicort.  KC, pls advise.  Thanks!

## 2010-11-21 ENCOUNTER — Telehealth: Payer: Self-pay | Admitting: Pulmonary Disease

## 2010-11-21 MED ORDER — FORMOTEROL FUMARATE 12 MCG IN CAPS: 12.0000 ug | ORAL_CAPSULE | Freq: Two times a day (BID) | RESPIRATORY_TRACT | Status: DC

## 2010-11-21 NOTE — Telephone Encounter (Signed)
Med sent to [facility]--pt aware

## 2010-11-21 NOTE — Telephone Encounter (Signed)
PT AWARE OF KC'S RESPONSE AND IS OK WITH THIS

## 2010-11-28 ENCOUNTER — Telehealth: Payer: Self-pay | Admitting: Pulmonary Disease

## 2010-11-28 MED ORDER — NYSTATIN 100000 UNIT/ML MT SUSP: OROMUCOSAL | Status: DC

## 2010-11-28 NOTE — Telephone Encounter (Signed)
Spoke with the pt and she states she still has thrush. She c/o white patches on back of her tongue, the size of a quarter.  She states she has been watching her carb and sugar intake because she noticed that this made the thrush worse. She also states she begun to have skin issues as well. She c/o itchy places on her arms and back that also "burn at times." She states her PCP did some blood work and all was normal except her liver function was low. She states they did not check her thyroid or blood count so she has an appt for Friday for this. Pt is currently on spiriva and foradil. Please advise. Carron Curie, CMA

## 2010-11-28 NOTE — Telephone Encounter (Signed)
Called spoke with patient, advised of KC's recs.  Pt verbalized her understanding and will try off the foradil.  Pt to call back with update.  rx sent to verified pharmacy.

## 2010-11-28 NOTE — Telephone Encounter (Signed)
We can call in nystatin suspension  400,000 IU swish and swallow tid for 5 days, QS, no fills She can stop the foradil is she feels this is the cause of her thrush.

## 2010-11-29 ENCOUNTER — Telehealth: Payer: Self-pay | Admitting: Pulmonary Disease

## 2010-11-29 NOTE — Telephone Encounter (Signed)
Verified with the pharmacy that rx was called in for nystatin. Pt is aware.

## 2010-12-05 ENCOUNTER — Telehealth: Payer: Self-pay | Admitting: Pulmonary Disease

## 2010-12-05 ENCOUNTER — Encounter: Payer: Self-pay | Admitting: Adult Health

## 2010-12-05 ENCOUNTER — Ambulatory Visit (INDEPENDENT_AMBULATORY_CARE_PROVIDER_SITE_OTHER): Payer: Medicare Other | Admitting: Adult Health

## 2010-12-05 VITALS — BP 122/76 | HR 82 | Temp 97.1°F | Ht 63.0 in | Wt 117.4 lb

## 2010-12-05 DIAGNOSIS — B370 Candidal stomatitis: Secondary | ICD-10-CM

## 2010-12-05 NOTE — Patient Instructions (Signed)
We are referring you to ENT to evaluate your recurrent mouth /throat issues  May try Align Probiotic daily  Eat yogurt daily .  May rinse mouth out with peroxide based mouth wash As needed   Brush, rinse and gargle after Spiriva daily  Please contact office for sooner follow up if symptoms do not improve or worsen or seek emergency care  follow up Dr. Shelle Iron as planned and As needed

## 2010-12-05 NOTE — Telephone Encounter (Signed)
Pt c/o ongoing thrush and says the Nystatin is not helping much at all. She is no longer using the Foradil but has continued on Spiriva. She has noticed her breathing is a little worse off Foradil as well. She is scheduled to see TP today to assess oral thrush and sob.

## 2010-12-05 NOTE — Progress Notes (Signed)
Subjective:    Patient ID: [name], female    DOB: January 16, 1941, 70 y.o.   MRN: [id number]  HPI 70 yo with known hx of Emphysema.   12/05/10 Acute  Pt presents for what she believes is recurrent thrush. She says over last 4 months has had several cases of thrush tx with Diflucan and nystatin.  Previous taken off Dulera and Symbicort due to mouth irritation. Says she has thicken coating on tongue at times that she has to scrape off and has seen white patches on tonugue. Taste buds are enlarged and sore at times. Some hoarseness. No dysphagia or over reflux. No weight loss. Previous smoker.    Past Medical History  Diagnosis Date  . Benign neoplasm of kidney   . Hyperlipidemia   . Hypothyroid   . COPD (chronic obstructive pulmonary disease)   . PE (pulmonary embolism)    Patient Active Problem List  Diagnoses  . NEOPLASM, BENIGN, KIDNEY  . HYPOTHYROIDISM  . HYPERLIPIDEMIA  . COPD (chronic obstructive pulmonary disease)  . Other pulmonary embolism and infarction  . Thrush         Current Outpatient Prescriptions on File Prior to Visit  Medication Sig Dispense Refill  . aspirin 81 MG tablet Take 81 mg by mouth daily.        . carvedilol (COREG) 12.5 MG tablet Take 12.5 mg by mouth 2 (two) times daily with a meal.        . levothyroxine (SYNTHROID, LEVOTHROID) 75 MCG tablet Take 75 mcg by mouth daily.        Marland Kitchen NIFEdipine (PROCARDIA-XL/ADALAT CC) 60 MG 24 hr tablet Take 60 mg by mouth daily.        . pravastatin (PRAVACHOL) 40 MG tablet Take 40 mg by mouth daily.        . sennosides-docusate sodium (SENOKOT-S) 8.6-50 MG tablet Take 1 tablet by mouth as needed.       . tiotropium (SPIRIVA) 18 MCG inhalation capsule Place 1 capsule (18 mcg total) into inhaler and inhale daily.  90 capsule  3  . vitamin C (ASCORBIC ACID) 500 MG tablet Take 1,000 mg by mouth as needed.       . formoterol (FORADIL) 12 MCG capsule for inhaler Place 1 capsule (12 mcg total) into inhaler and inhale  2 (two) times daily.  180 capsule  3  . nystatin (MYCOSTATIN) 100000 UNIT/ML suspension 4 mL's swish, gargle and spit out 3 times a day x 5 days  60 mL  0   Allergies  Allergen Reactions  . Penicillins     REACTION: hives    Review of Systems Constitutional:   No  weight loss, night sweats,  Fevers, chills, fatigue, or  lassitude.  HEENT:   No headaches,  Difficulty swallowing,  Tooth/dental problems,                No sneezing, itching, ear ache, nasal congestion, post nasal drip,   CV:  No chest pain,  Orthopnea, PND, swelling in lower extremities, anasarca, dizziness, palpitations, syncope.   GI  No heartburn, indigestion, abdominal pain, nausea, vomiting, diarrhea, change in bowel habits, loss of appetite, bloody stools.   Resp:.  No excess mucus, no productive cough,  No non-productive cough,  No coughing up of blood.  No change in color of mucus.  No wheezing.  No chest wall deformity  Skin: no rash or lesions.  GU: no dysuria, change in color of urine, no urgency or frequency.  No flank pain, no hematuria   MS:  No joint pain or swelling.  No decreased range of motion.  No back pain.  Psych:  No change in mood or affect. No depression or anxiety.  No memory loss.          Objective:   Physical Exam GEN: A/Ox3; pleasant , NAD  HEENT:  /AT,  EACs-clear, TMs-wnl, NOSE-clear, THROAT-clear, no lesions, no postnasal drip or exudate noted No obvious candida noted , posterior tongue w/ ?enlarge taste buds   NECK:  Supple w/ fair ROM; no JVD; normal carotid impulses w/o bruits; no thyromegaly or nodules palpated; no lymphadenopathy.  RESP  CTA bilaterally w/ decreased BS in bases , no wheezing .no accessory muscle use, no dullness to percussion  CARD:  RRR, no m/r/g  , no peripheral edema, pulses intact, no cyanosis or clubbing.  GI:   Soft & nt; nml bowel sounds; no organomegaly or masses detected.  Musco: Warm bil, no deformities or joint swelling noted.   Neuro:  alert, no focal deficits noted.    Skin: Warm, no lesions or rashes          Assessment & Plan:

## 2010-12-06 NOTE — Assessment & Plan Note (Addendum)
Per pt hx recurrent thrush with several treatements with diflucan and nystatin without total resolution and return of symptoms  On exam today , no obvious candidias noted. She has been taken off ICS. Advised to brush/rinse after Spiriiva  She has a smoking hx -would like for her to be seen by ENT to evaluate her tongue/throat to rule out other process or unseen thrush in throat   Plan:  We are referring you to ENT to evaluate your recurrent mouth /throat issues  May try Align Probiotic daily  Eat yogurt daily .  May rinse mouth out with peroxide based mouth wash As needed   Brush, rinse and gargle after Spiriva daily  Please contact office for sooner follow up if symptoms do not improve or worsen or seek emergency care  follow up Dr. Shelle Iron as planned and As needed

## 2010-12-06 NOTE — Progress Notes (Signed)
Note reviewed in detail, and agree with plan as outlined.

## 2011-02-07 ENCOUNTER — Ambulatory Visit: Payer: Medicare Other | Admitting: Pulmonary Disease

## 2011-02-10 ENCOUNTER — Ambulatory Visit: Payer: Medicare Other | Admitting: Pulmonary Disease

## 2011-02-14 ENCOUNTER — Ambulatory Visit (INDEPENDENT_AMBULATORY_CARE_PROVIDER_SITE_OTHER): Payer: Medicare Other | Admitting: Pulmonary Disease

## 2011-02-14 ENCOUNTER — Encounter: Payer: Self-pay | Admitting: Pulmonary Disease

## 2011-02-14 VITALS — BP 120/76 | HR 70 | Temp 97.5°F | Ht 63.0 in | Wt 113.8 lb

## 2011-02-14 DIAGNOSIS — J449 Chronic obstructive pulmonary disease, unspecified: Secondary | ICD-10-CM

## 2011-02-14 NOTE — Assessment & Plan Note (Signed)
The patient has continued on Spiriva and feels that it does help her breathing, but does admit that she did better when taking Foradil as well.  She has seen ENT who feels that she does not have thrush, and is scheduled to see dermatology.  Said she did see an additional improvement while taking Foradil, I have given her the option of going back on this medication for a trial period.  She will think about this.

## 2011-02-14 NOTE — Patient Instructions (Signed)
Stay on spiriva Can try foradil am and pm again if you would like to see if it will help your breathing. Stay active followup with me in 6mos.

## 2011-02-14 NOTE — Progress Notes (Signed)
  Subjective:    Patient ID: [name], female    DOB: Sep 14, 1940, 70 y.o.   MRN: [id number]  HPI The patient comes in today for followup of her COPD.  She has been intolerant to inhaled corticosteroids, and recently stopped her LABA because she felt it causes thrush as well.  She saw a difference when she was on the combination of Spiriva and Foradil.  She has been seen by ENT, who does not believe that she has thrush.  She has been referred to dermatology for further evaluation.  Overall, the patient feels that her breathing is near her usual baseline.  She denies any significant cough or chest congestion.   Review of Systems  Constitutional: Positive for appetite change and unexpected weight change. Negative for fever.  HENT: Positive for postnasal drip. Negative for ear pain, nosebleeds, congestion, sore throat, rhinorrhea, sneezing, trouble swallowing, dental problem and sinus pressure.   Eyes: Negative for redness and itching.  Respiratory: Positive for chest tightness. Negative for cough, shortness of breath and wheezing.   Cardiovascular: Negative for palpitations and leg swelling.  Gastrointestinal: Negative for nausea and vomiting.  Genitourinary: Negative for dysuria.  Musculoskeletal: Negative for joint swelling.  Skin: Negative for rash.  Neurological: Negative for headaches.  Hematological: Bruises/bleeds easily.  Psychiatric/Behavioral: Negative for dysphoric mood. The patient is not nervous/anxious.        Objective:   Physical Exam Thin female in no acute distress Nose without purulence or discharge noted Chest with mild decrease in breath sounds in the bases, no wheezes or rhonchi Cardiac exam with regular rate and rhythm Lower extremities with no edema, no cyanosis noted Alert and oriented, moves all 4 extremities.       Assessment & Plan:

## 2011-08-15 ENCOUNTER — Ambulatory Visit: Payer: Medicare Other | Admitting: Pulmonary Disease

## 2011-08-22 ENCOUNTER — Encounter (HOSPITAL_COMMUNITY): Payer: Self-pay

## 2011-08-22 ENCOUNTER — Emergency Department (HOSPITAL_COMMUNITY)
Admission: EM | Admit: 2011-08-22 | Discharge: 2011-08-22 | Disposition: A | Discharge status: Home / Self Care | Payer: Medicare Other | Attending: Emergency Medicine | Admitting: Emergency Medicine

## 2011-08-22 DIAGNOSIS — E039 Hypothyroidism, unspecified: Secondary | ICD-10-CM | POA: Insufficient documentation

## 2011-08-22 DIAGNOSIS — J449 Chronic obstructive pulmonary disease, unspecified: Secondary | ICD-10-CM | POA: Insufficient documentation

## 2011-08-22 DIAGNOSIS — N390 Urinary tract infection, site not specified: Secondary | ICD-10-CM | POA: Insufficient documentation

## 2011-08-22 DIAGNOSIS — Z79899 Other long term (current) drug therapy: Secondary | ICD-10-CM | POA: Insufficient documentation

## 2011-08-22 DIAGNOSIS — J4489 Other specified chronic obstructive pulmonary disease: Secondary | ICD-10-CM | POA: Insufficient documentation

## 2011-08-22 DIAGNOSIS — R319 Hematuria, unspecified: Secondary | ICD-10-CM | POA: Insufficient documentation

## 2011-08-22 DIAGNOSIS — E785 Hyperlipidemia, unspecified: Secondary | ICD-10-CM | POA: Insufficient documentation

## 2011-08-22 LAB — URINALYSIS, ROUTINE W REFLEX MICROSCOPIC
Glucose, UA: NEGATIVE mg/dL
Protein, ur: 100 mg/dL — AB
pH: 6 (ref 5.0–8.0)

## 2011-08-22 LAB — POCT I-STAT, CHEM 8
Calcium, Ion: 1.21 mmol/L (ref 1.12–1.32)
Creatinine, Ser: 0.9 mg/dL (ref 0.50–1.10)
Glucose, Bld: 106 mg/dL — ABNORMAL HIGH (ref 70–99)
Hemoglobin: 15 g/dL (ref 12.0–15.0)
TCO2: 24 mmol/L (ref 0–100)

## 2011-08-22 MED ORDER — CIPROFLOXACIN HCL 500 MG PO TABS: 500.0000 mg | ORAL_TABLET | Freq: Two times a day (BID) | ORAL | Status: DC

## 2011-08-22 MED ORDER — CIPROFLOXACIN HCL 500 MG PO TABS: 500.0000 mg | ORAL_TABLET | Freq: Two times a day (BID) | ORAL | Status: AC

## 2011-08-22 NOTE — ED Notes (Signed)
Pt stating that around 6 am she used the bathroom and saw blood in her urine and she was burning while urinating

## 2011-08-22 NOTE — ED Provider Notes (Signed)
History     CSN: [id number]  Arrival date & time 08/22/11  4098   First MD Initiated Contact with Patient 08/22/11 [phone]      Chief Complaint  Patient presents with  . Hematuria    (Consider location/radiation/quality/duration/timing/severity/associated sxs/prior treatment) HPI Comments: [name] is a 71 y.o. female who voided per usual this morning and noted blood. Single episode. No dysuria, urgency, urinary frequency or back pain, fever, chills. No nausea or vomiting, weakness, paresthesias. She's never had this before. No medications were tried.  Patient is a 71 y.o. female presenting with hematuria. The history is provided by the patient and a relative.  Hematuria    Past Medical History  Diagnosis Date  . Benign neoplasm of kidney   . Hyperlipidemia   . Hypothyroid   . COPD (chronic obstructive pulmonary disease)   . PE (pulmonary embolism)     Past Surgical History  Procedure Date  . Total abdominal hysterectomy 1975  . 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 Heart disease Mother   . Heart disease Father   . Stroke Mother     History  Substance Use Topics  . Smoking status: Former Smoker -- 1.0 packs/day for 40 years    Types: Cigarettes    Quit date: 04/17/1998  . Smokeless tobacco: Not on file  . Alcohol Use: No    OB History    Grav Para Term Preterm Abortions TAB SAB Ect Mult Living                  Review of Systems  Genitourinary: Positive for hematuria.  All other systems reviewed and are negative.    Allergies  Penicillins  Home Medications   Current Outpatient Rx  Name Route Sig Dispense Refill  . ASPIRIN 81 MG PO TABS Oral Take 81 mg by mouth daily.      Marland Kitchen CARVEDILOL 12.5 MG PO TABS Oral Take 12.5 mg by mouth 2 (two) times daily with a meal.      . DOXEPIN HCL 25 MG PO CAPS Oral Take 25-59 mg by mouth at bedtime.    Marland Kitchen FLUOCINONIDE 0.05 % EX CREA Topical Apply 1 application  topically 2 (two) times daily.    Marland Kitchen FORMOTEROL FUMARATE 12 MCG IN CAPS Inhalation Place 12 mcg into inhaler and inhale 2 (two) times daily.    Marland Kitchen LEVOTHYROXINE SODIUM 75 MCG PO TABS Oral Take 75 mcg by mouth daily.      Marland Kitchen NIFEDIPINE ER 60 MG PO TB24 Oral Take 60 mg by mouth daily.      Marland Kitchen PRAVASTATIN SODIUM 40 MG PO TABS Oral Take 40 mg by mouth daily.      . SENNA-DOCUSATE SODIUM 8.6-50 MG PO TABS Oral Take 1 tablet by mouth as needed.     Marland Kitchen TIOTROPIUM BROMIDE MONOHYDRATE 18 MCG IN CAPS Inhalation Place 1 capsule (18 mcg total) into inhaler and inhale daily. 90 capsule 3  . VITAMIN C 500 MG PO TABS Oral Take 1,000 mg by mouth as needed.     Marland Kitchen CIPROFLOXACIN HCL 500 MG PO TABS Oral Take 1 tablet (500 mg total) by mouth every 12 (twelve) hours. 10 tablet 0    BP 127/53  Pulse 71  Temp(Src) 97.8 F (36.6 C) (Oral)  Resp 18  SpO2 95%  Physical Exam  Nursing note and vitals reviewed. Constitutional: She is oriented to person, place, and time. She appears  well-developed and well-nourished.  HENT:  Head: Normocephalic and atraumatic.  Eyes: Conjunctivae and EOM are normal. Pupils are equal, round, and reactive to light.  Neck: Normal range of motion and phonation normal. Neck supple.  Cardiovascular: Normal rate, regular rhythm and intact distal pulses.   Pulmonary/Chest: Effort normal and breath sounds normal. She exhibits no tenderness.  Abdominal: Soft. She exhibits no distension. There is no tenderness. There is no guarding.  Musculoskeletal: Normal range of motion.  Neurological: She is alert and oriented to person, place, and time. She has normal strength. She exhibits normal muscle tone.  Skin: Skin is warm and dry.       Ill-defined generalized rash  Psychiatric: She has a normal mood and affect. Her behavior is normal. Judgment and thought content normal.    ED Course  Procedures (including critical care time)  Labs Reviewed  URINALYSIS, ROUTINE W REFLEX MICROSCOPIC - Abnormal;  Notable for the following:    Color, Urine RED (*) BIOCHEMICALS MAY BE AFFECTED BY COLOR   APPearance TURBID (*)    Hgb urine dipstick LARGE (*)    Bilirubin Urine LARGE (*)    Ketones, ur 15 (*)    Protein, ur 100 (*)    Leukocytes, UA MODERATE (*)    All other components within normal limits  POCT I-STAT, CHEM 8 - Abnormal; Notable for the following:    Glucose, Bld 106 (*)    All other components within normal limits  URINE MICROSCOPIC-ADD ON  URINE CULTURE   No results found.   1. UTI (lower urinary tract infection)   2. Hematuria       MDM  Hematuria without pain, or systemic sx. Possible uti, culture pending. Pt stable for d/c. Doubt urolithiasis, renal bleed, sepsis, metabolic instability.        Flint Melter, MD 08/22/11 [phone]

## 2011-08-22 NOTE — Discharge Instructions (Signed)
Call the urologist, for a followup appointment in 7-10 days.  Drink plenty of water each day. Return here if needed for problems.  Hematuria, Adult Hematuria (blood in your urine) can be caused by a bladder infection (cystitis), kidney infection (pyelonephritis), prostate infection (prostatitis), or kidney stone. Infections will usually respond to antibiotics (medications which kill germs), and a kidney stone will usually pass through your urine without further treatment. If you were put on antibiotics, take all the medicine until gone. You may feel better in a few days, but take all of your medicine or the infection may not respond and become more difficult to treat. If antibiotics were not given, an infection did not cause the blood in the urine. A further work up to find out the reason may be needed. HOME CARE INSTRUCTIONS   Drink lots of fluid, 3 to 4 quarts a day. If you have been diagnosed with an infection, cranberry juice is especially recommended, in addition to large amounts of water.   Avoid caffeine, tea, and carbonated beverages, because they tend to irritate the bladder.   Avoid alcohol as it may irritate the prostate.   Only take over-the-counter or prescription medicines for pain, discomfort, or fever as directed by your caregiver.   If you have been diagnosed with a kidney stone follow your caregivers instructions regarding straining your urine to catch the stone.  TO PREVENT FURTHER INFECTIONS:  Empty the bladder often. Avoid holding urine for long periods of time.   After a bowel movement, women should cleanse front to back. Use each tissue only once.   Empty the bladder before and after sexual intercourse if you are a female.   Return to your caregiver if you develop back pain, fever, nausea (feeling sick to your stomach), vomiting, or your symptoms (problems) are not better in 3 days. Return sooner if you are getting worse.  If you have been requested to return for  further testing make sure to keep your appointments. If an infection is not the cause of blood in your urine, X-rays may be required. Your caregiver will discuss this with you. SEEK IMMEDIATE MEDICAL CARE IF:   You have a persistent fever over 102 F (38.9 C).   You develop severe vomiting and are unable to keep the medication down.   You develop severe back or abdominal pain despite taking your medications.   You begin passing a large amount of blood or clots in your urine.   You feel extremely weak or faint, or pass out.  MAKE SURE YOU:   Understand these instructions.   Will watch your condition.   Will get help right away if you are not doing well or get worse.  Document Released: 04/03/2005 Document Revised: 03/23/2011 Document Reviewed: 11/21/2007 [facility] Patient Information 2012 [place], [place].Urinary Tract Infection Infections of the urinary tract can start in several places. A bladder infection (cystitis), a kidney infection (pyelonephritis), and a prostate infection (prostatitis) are different types of urinary tract infections (UTIs). They usually get better if treated with medicines (antibiotics) that kill germs. Take all the medicine until it is gone. You or your child may feel better in a few days, but TAKE ALL MEDICINE or the infection may not respond and may become more difficult to treat. HOME CARE INSTRUCTIONS   Drink enough water and fluids to keep the urine clear or pale yellow. Cranberry juice is especially recommended, in addition to large amounts of water.   Avoid caffeine, tea, and carbonated beverages.  They tend to irritate the bladder.   Alcohol may irritate the prostate.   Only take over-the-counter or prescription medicines for pain, discomfort, or fever as directed by your caregiver.  To prevent further infections:  Empty the bladder often. Avoid holding urine for long periods of time.   After a bowel movement, women should cleanse from front to back.  Use each tissue only once.   Empty the bladder before and after sexual intercourse.  FINDING OUT THE RESULTS OF YOUR TEST Not all test results are available during your visit. If your or your child's test results are not back during the visit, make an appointment with your caregiver to find out the results. Do not assume everything is normal if you have not heard from your caregiver or the medical facility. It is important for you to follow up on all test results. SEEK MEDICAL CARE IF:   There is back pain.   Your baby is older than 3 months with a rectal temperature of 100.5 F (38.1 C) or higher for more than 1 day.   Your or your child's problems (symptoms) are no better in 3 days. Return sooner if you or your child is getting worse.  SEEK IMMEDIATE MEDICAL CARE IF:   There is severe back pain or lower abdominal pain.   You or your child develops chills.   You have a fever.   Your baby is older than 3 months with a rectal temperature of 102 F (38.9 C) or higher.   Your baby is 27 months old or younger with a rectal temperature of 100.4 F (38 C) or higher.   There is nausea or vomiting.   There is continued burning or discomfort with urination.  MAKE SURE YOU:   Understand these instructions.   Will watch your condition.   Will get help right away if you are not doing well or get worse.  Document Released: 01/11/2005 Document Revised: 03/23/2011 Document Reviewed: 08/16/2006 [facility] Patient Information 2012 [place], [place].

## 2011-08-23 LAB — URINE CULTURE
Colony Count: NO GROWTH
Culture  Setup Time: 201305071419
Culture: NO GROWTH

## 2011-08-25 ENCOUNTER — Encounter: Payer: Self-pay | Admitting: Pulmonary Disease

## 2011-08-25 ENCOUNTER — Ambulatory Visit (INDEPENDENT_AMBULATORY_CARE_PROVIDER_SITE_OTHER): Payer: Medicare Other | Admitting: Pulmonary Disease

## 2011-08-25 VITALS — BP 122/80 | HR 83 | Temp 97.7°F | Ht 63.0 in | Wt 122.8 lb

## 2011-08-25 DIAGNOSIS — J449 Chronic obstructive pulmonary disease, unspecified: Secondary | ICD-10-CM

## 2011-08-25 NOTE — Patient Instructions (Signed)
No change in your breathing meds. Try to stay as active as possible, and work on conditioning. If doing well, will see back in 6mos.

## 2011-08-25 NOTE — Progress Notes (Signed)
  Subjective:    Patient ID: [name], female    DOB: 11/30/1940, 71 y.o.   MRN: [id number]  HPI Patient comes in today for followup of her known COPD.  She is back on her long-acting beta agonist and long-acting anticholinergic, and feels that she is doing fairly well.  She denies any significant cough or mucus production.  She feels that her exertional tolerance is at baseline, and she is remaining functional.  She has not had a recent acute exacerbation her pulmonary infection.   Review of Systems  Constitutional: Negative.  Negative for fever and unexpected weight change.  HENT: Positive for postnasal drip. Negative for ear pain, nosebleeds, congestion, sore throat, rhinorrhea, sneezing, trouble swallowing, dental problem and sinus pressure.   Eyes: Positive for itching. Negative for redness.  Respiratory: Negative.  Negative for cough, chest tightness, shortness of breath and wheezing.   Cardiovascular: Negative.  Negative for palpitations and leg swelling.  Gastrointestinal: Negative.  Negative for nausea and vomiting.  Genitourinary: Negative.  Negative for dysuria.  Musculoskeletal: Negative.  Negative for joint swelling.  Skin: Negative.  Negative for rash.  Neurological: Negative.  Negative for headaches.  Hematological: Negative.  Does not bruise/bleed easily.  Psychiatric/Behavioral: Negative.  Negative for dysphoric mood. The patient is not nervous/anxious.        Objective:   Physical Exam Well-developed female in no acute distress Nose without purulence or discharge noted Chest with mild decrease in breath sounds, no wheezes or rhonchi Cardiac exam with regular rate and rhythm Lower extremities without edema, cyanosis Alert and oriented, moves all 4 extremities.        Assessment & Plan:

## 2011-08-25 NOTE — Assessment & Plan Note (Signed)
The patient is doing very well on her current bronchodilator regimen.  I have asked her to stay active, and to really work on her conditioning.  If she is doing well, I would like to see her back in 6 months.

## 2011-09-12 ENCOUNTER — Other Ambulatory Visit: Payer: Self-pay | Admitting: Urology

## 2011-09-13 ENCOUNTER — Encounter (HOSPITAL_BASED_OUTPATIENT_CLINIC_OR_DEPARTMENT_OTHER): Payer: Self-pay | Admitting: *Deleted

## 2011-09-13 NOTE — Progress Notes (Signed)
NPO AFTER MN. ARRIVE AT 0900. NEEDS ISTAT, EKG, AND CXR. WILL TAKE AM MEDS AND INHALERS AM OF SURG. W/ SIPS OF WATER.

## 2011-09-18 ENCOUNTER — Encounter (HOSPITAL_BASED_OUTPATIENT_CLINIC_OR_DEPARTMENT_OTHER): Payer: Self-pay | Admitting: Anesthesiology

## 2011-09-18 ENCOUNTER — Encounter (HOSPITAL_BASED_OUTPATIENT_CLINIC_OR_DEPARTMENT_OTHER): Payer: Self-pay | Admitting: *Deleted

## 2011-09-18 ENCOUNTER — Ambulatory Visit (HOSPITAL_BASED_OUTPATIENT_CLINIC_OR_DEPARTMENT_OTHER): Payer: Medicare Other | Admitting: Anesthesiology

## 2011-09-18 ENCOUNTER — Encounter (HOSPITAL_BASED_OUTPATIENT_CLINIC_OR_DEPARTMENT_OTHER)
Admission: RE | Disposition: A | Discharge status: Home / Self Care | Payer: Self-pay | Source: Ambulatory Visit | Attending: Urology

## 2011-09-18 ENCOUNTER — Ambulatory Visit (HOSPITAL_COMMUNITY): Payer: Medicare Other

## 2011-09-18 ENCOUNTER — Ambulatory Visit (HOSPITAL_BASED_OUTPATIENT_CLINIC_OR_DEPARTMENT_OTHER)
Admission: RE | Admit: 2011-09-18 | Discharge: 2011-09-18 | Disposition: A | Discharge status: Home / Self Care | Payer: Medicare Other | Source: Ambulatory Visit | Attending: Urology | Admitting: Urology

## 2011-09-18 DIAGNOSIS — E039 Hypothyroidism, unspecified: Secondary | ICD-10-CM | POA: Insufficient documentation

## 2011-09-18 DIAGNOSIS — J4489 Other specified chronic obstructive pulmonary disease: Secondary | ICD-10-CM | POA: Insufficient documentation

## 2011-09-18 DIAGNOSIS — I10 Essential (primary) hypertension: Secondary | ICD-10-CM | POA: Insufficient documentation

## 2011-09-18 DIAGNOSIS — C679 Malignant neoplasm of bladder, unspecified: Secondary | ICD-10-CM | POA: Insufficient documentation

## 2011-09-18 DIAGNOSIS — D494 Neoplasm of unspecified behavior of bladder: Secondary | ICD-10-CM | POA: Diagnosis present

## 2011-09-18 DIAGNOSIS — E7800 Pure hypercholesterolemia, unspecified: Secondary | ICD-10-CM | POA: Insufficient documentation

## 2011-09-18 DIAGNOSIS — Z7982 Long term (current) use of aspirin: Secondary | ICD-10-CM | POA: Insufficient documentation

## 2011-09-18 DIAGNOSIS — Z79899 Other long term (current) drug therapy: Secondary | ICD-10-CM | POA: Insufficient documentation

## 2011-09-18 DIAGNOSIS — J449 Chronic obstructive pulmonary disease, unspecified: Secondary | ICD-10-CM | POA: Insufficient documentation

## 2011-09-18 HISTORY — DX: Chronic pulmonary embolism: I27.82

## 2011-09-18 HISTORY — DX: Other complications of anesthesia, initial encounter: T88.59XA

## 2011-09-18 HISTORY — DX: Neoplasm of unspecified behavior of bladder: D49.4

## 2011-09-18 HISTORY — DX: History of falling: Z91.81

## 2011-09-18 HISTORY — DX: Reserved for inherently not codable concepts without codable children: IMO0001

## 2011-09-18 HISTORY — DX: Encounter for other specified aftercare: Z51.89

## 2011-09-18 HISTORY — DX: Emphysema, unspecified: J43.9

## 2011-09-18 HISTORY — DX: Adverse effect of unspecified anesthetic, initial encounter: T41.45XA

## 2011-09-18 HISTORY — PX: TRANSURETHRAL RESECTION OF BLADDER TUMOR: SHX2575

## 2011-09-18 LAB — POCT I-STAT 4, (NA,K, GLUC, HGB,HCT)
Glucose, Bld: 100 mg/dL — ABNORMAL HIGH (ref 70–99)
HCT: 44 % (ref 36.0–46.0)
Hemoglobin: 15 g/dL (ref 12.0–15.0)
Potassium: 4 mEq/L (ref 3.5–5.1)
Sodium: 142 mEq/L (ref 135–145)

## 2011-09-18 SURGERY — TURBT (TRANSURETHRAL RESECTION OF BLADDER TUMOR)
Anesthesia: General | Site: Bladder | Wound class: Clean Contaminated

## 2011-09-18 MED ORDER — FENTANYL CITRATE 0.05 MG/ML IJ SOLN
INTRAMUSCULAR | Status: DC | PRN
  Administered 2011-09-18 (×2): 25 ug via INTRAVENOUS

## 2011-09-18 MED ORDER — LIDOCAINE HCL (CARDIAC) 20 MG/ML IV SOLN
INTRAVENOUS | Status: DC | PRN
  Administered 2011-09-18: 40 mg via INTRAVENOUS

## 2011-09-18 MED ORDER — CIPROFLOXACIN IN D5W 200 MG/100ML IV SOLN
200.0000 mg | INTRAVENOUS | Status: AC
  Administered 2011-09-18: 200 mg via INTRAVENOUS

## 2011-09-18 MED ORDER — PHENAZOPYRIDINE HCL 200 MG PO TABS: 200.0000 mg | ORAL_TABLET | Freq: Three times a day (TID) | ORAL | Status: AC | PRN

## 2011-09-18 MED ORDER — HYDROCODONE-ACETAMINOPHEN 7.5-325 MG PO TABS: 1.0000 | ORAL_TABLET | ORAL | Status: AC | PRN

## 2011-09-18 MED ORDER — FENTANYL CITRATE 0.05 MG/ML IJ SOLN: 25.0000 ug | INTRAMUSCULAR | Status: DC | PRN

## 2011-09-18 MED ORDER — SODIUM CHLORIDE 0.9 % IR SOLN
Status: DC | PRN
  Administered 2011-09-18: 6000 mL

## 2011-09-18 MED ORDER — BELLADONNA ALKALOIDS-OPIUM 16.2-60 MG RE SUPP
RECTAL | Status: DC | PRN
  Administered 2011-09-18: 1 via RECTAL

## 2011-09-18 MED ORDER — LACTATED RINGERS IV SOLN
INTRAVENOUS | Status: DC
  Administered 2011-09-18: 10:00:00 via INTRAVENOUS

## 2011-09-18 MED ORDER — PHENAZOPYRIDINE HCL 200 MG PO TABS
200.0000 mg | ORAL_TABLET | Freq: Once | ORAL | Status: AC
  Administered 2011-09-18: 200 mg via ORAL

## 2011-09-18 MED ORDER — DEXAMETHASONE SODIUM PHOSPHATE 4 MG/ML IJ SOLN
INTRAMUSCULAR | Status: DC | PRN
  Administered 2011-09-18: 10 mg via INTRAVENOUS

## 2011-09-18 MED ORDER — ONDANSETRON HCL 4 MG/2ML IJ SOLN
INTRAMUSCULAR | Status: DC | PRN
  Administered 2011-09-18: 4 mg via INTRAVENOUS

## 2011-09-18 MED ORDER — PROPOFOL 10 MG/ML IV EMUL
INTRAVENOUS | Status: DC | PRN
  Administered 2011-09-18: 20 mg via INTRAVENOUS
  Administered 2011-09-18: 150 mg via INTRAVENOUS

## 2011-09-18 MED ORDER — LACTATED RINGERS IV SOLN: INTRAVENOUS | Status: DC

## 2011-09-18 MED ORDER — PROMETHAZINE HCL 25 MG/ML IJ SOLN: 6.2500 mg | INTRAMUSCULAR | Status: DC | PRN

## 2011-09-18 MED ORDER — OXYBUTYNIN CHLORIDE 5 MG PO TABS
5.0000 mg | ORAL_TABLET | Freq: Once | ORAL | Status: AC
  Administered 2011-09-18: 5 mg via ORAL

## 2011-09-18 SURGICAL SUPPLY — 34 items
BAG DRAIN URO-CYSTO SKYTR STRL (DRAIN) ×2 IMPLANT
BAG DRN ANRFLXCHMBR STRAP LEK (BAG)
BAG DRN UROCATH (DRAIN) ×1
BAG URINE DRAINAGE (UROLOGICAL SUPPLIES) IMPLANT
BAG URINE LEG 19OZ MD ST LTX (BAG) IMPLANT
CANISTER SUCT LVC 12 LTR MEDI- (MISCELLANEOUS) ×4 IMPLANT
CATH FOLEY 2WAY SLVR  5CC 20FR (CATHETERS)
CATH FOLEY 2WAY SLVR  5CC 22FR (CATHETERS)
CATH FOLEY 2WAY SLVR  5CC 24FR (CATHETERS)
CATH FOLEY 2WAY SLVR 5CC 20FR (CATHETERS) IMPLANT
CATH FOLEY 2WAY SLVR 5CC 22FR (CATHETERS) IMPLANT
CATH FOLEY 2WAY SLVR 5CC 24FR (CATHETERS) IMPLANT
CLOTH BEACON ORANGE TIMEOUT ST (SAFETY) ×2 IMPLANT
DRAPE CAMERA CLOSED 9X96 (DRAPES) ×2 IMPLANT
ELECT BUTTON BIOP 24F 90D PLAS (MISCELLANEOUS) IMPLANT
ELECT LOOP HF 26F 30D .35MM (CUTTING LOOP) IMPLANT
ELECT LOOP MED HF 24F 12D CBL (CLIP) ×2 IMPLANT
ELECT REM PT RETURN 9FT ADLT (ELECTROSURGICAL)
ELECT RESECT VAPORIZE 12D CBL (ELECTRODE) IMPLANT
ELECTRODE REM PT RTRN 9FT ADLT (ELECTROSURGICAL) IMPLANT
EVACUATOR MICROVAS BLADDER (UROLOGICAL SUPPLIES) IMPLANT
GLOVE BIO SURGEON STRL SZ8 (GLOVE) ×2 IMPLANT
GLOVE BIOGEL PI IND STRL 6.5 (GLOVE) ×2 IMPLANT
GLOVE BIOGEL PI INDICATOR 6.5 (GLOVE) ×2
GOWN PREVENTION PLUS LG XLONG (DISPOSABLE) ×2 IMPLANT
GOWN STRL REIN XL XLG (GOWN DISPOSABLE) ×2 IMPLANT
HOLDER FOLEY CATH W/STRAP (MISCELLANEOUS) IMPLANT
IV NS IRRIG 3000ML ARTHROMATIC (IV SOLUTION) ×4 IMPLANT
KIT ASPIRATION TUBING (SET/KITS/TRAYS/PACK) ×2 IMPLANT
LOOP CUTTING 24FR OLYMPUS (CUTTING LOOP) IMPLANT
PACK CYSTOSCOPY (CUSTOM PROCEDURE TRAY) ×2 IMPLANT
PLUG CATH AND CAP STER (CATHETERS) IMPLANT
SET ASPIRATION TUBING (TUBING) IMPLANT
WATER STERILE IRR 3000ML UROMA (IV SOLUTION) ×2 IMPLANT

## 2011-09-18 NOTE — H&P (Signed)
History of Present Illness   71 YO female seen today as an ER follow-up. Was seen in the ER 08/22/11 for painless gross hematuria with negative urine culture. D/C home with Cipro 500 mg 1 po BID X 5 days. H/O tobacco use 1/2-1 PPD X 35 yrs but none since X 15 yrs.  GU surgery: Hysterctomy many yrs ago. No recent instrumentation.   Interval HX:  States that she had gross hematuria w/o clots. No previous h/o hematuria or nephrolithasis.   Past Medical History Problems  1. History of  Anxiety (Symptom) 300.00 2. History of  Chronic Obstructive Pulmonary Disease 496 3. History of  Depression 311 4. History of  Heart Disease 429.9 5. History of  Hypercholesterolemia 272.0 6. History of  Hypertension 401.9 7. History of  Hypothyroidism 244.9  Surgical History Problems  1. History of  Hip Surgery 2. History of  Hysterectomy V45.77 3. History of  Shoulder Surgery  Current Meds 1. Aspirin 81 MG Oral Tablet; Therapy: (Recorded:17May2013) to 2. Carvedilol 12.5 MG Oral Tablet; Therapy: (Recorded:17May2013) to 3. Doxepin HCl 25 MG Oral Capsule; Therapy: (Recorded:17May2013) to 4. Fluocinonide-E 0.05 % External Cream; Therapy: (Recorded:17May2013) to 5. Foradil Aerolizer 12 MCG Inhalation Capsule; Therapy: (Recorded:17May2013) to 6. Levothyroxine Sodium 75 MCG Oral Tablet; Therapy: (Recorded:17May2013) to 7. NIFEdipine 60 MG TBCR; Therapy: (Recorded:17May2013) to 8. Pravastatin Sodium 40 MG Oral Tablet; Therapy: (Recorded:17May2013) to 9. Spiriva HandiHaler 18 MCG Inhalation Capsule; Therapy: (Recorded:17May2013) to  Allergies Medication  1. Penicillins  Family History Problems  1. Maternal history of  Cardiac Arrest 2. Paternal history of  Cardiac Arrest 3. Paternal history of  Death In The Family Father father passed @ age 56heart attack 4. Family history of  Family Health Status Number Of Children 1 son2 daughters 5. Maternal history of  Stroke Syndrome V17.1  Social  History Problems  1. Caffeine Use 1/2 cup daily 2. Marital History - Divorced V61.03 3. Occupation: Retired 4. History of  Tobacco Use 305.1 smoked 1/2-1 pack dailysmoked for 35 yearsquit smoking 15 years ago Denied  5. History of  Alcohol Use  Review of Systems Genitourinary and constitutional system(s) were reviewed and pertinent findings if present are noted.  Genitourinary: hematuria.    Vitals Vital Signs  BMI Calculated: 21.62 BSA Calculated: 1.57 Height: 5 ft 3 in Weight: 122 lb  Blood Pressure: 133 / 67 Heart Rate: 73  Review of Systems Genitourinary, constitutional, skin, eye, otolaryngeal, hematologic/lymphatic, cardiovascular, pulmonary, endocrine, musculoskeletal, gastrointestinal, neurological and psychiatric system(s) were reviewed and pertinent findings if present are noted.  Genitourinary: nocturia, incontinence and hematuria.  Gastrointestinal: constipation.  Respiratory: shortness of breath.  Psychiatric: anxiety.    Physical Exam Constitutional: Well nourished and well developed . No acute distress. The patient appears well hydrated.  ENT:. The ears and nose are normal in appearance.  Neck: The appearance of the neck is normal.  Pulmonary: No respiratory distress.  Cardiovascular: Heart rate and rhythm are normal.  Abdomen: The abdomen is flat. The abdomen is soft and nontender. No suprapubic tenderness. No CVA tenderness. No hernias are palpable.  Genitourinary: Examination of the external genitalia shows vulvar atrophy, but no labial adhesions. The urethra is normal in appearance. Vaginal exam demonstrates atrophy and the vaginal epithelium to be poorly estrogenized. The anus is normal on inspection. The perineum is normal on inspection.  Skin: Normal skin turgor and normal skin color and pigmentation.  Neuro/Psych:. Mood and affect are appropriate.   Assessment Assessed  I went over the results of his workup which has revealed normal renal  function study as well as normal upper tracts by CT scan. We discussed the presence of bilateral simple cysts which are of no clinical significance. I also discussed the abnormality of the bladder seen on her CT scan. 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 It appears superficial with a narrow stalk. It's on the right-hand side on the base. I therefore have discussed transurethral resection of the lesion but it would appear that she is likely not going to need any adjunctive treatment such as radiation or chemotherapy. I told her I would know for sure once the lesion had been removed and analyzed pathologically. We went over the procedure with her in detail including its risks and complications. She understands and has elected to proceed.   Plan   1. She will stop her aspirin until after the procedure. 2. She is scheduled for TURBT.

## 2011-09-18 NOTE — Anesthesia Procedure Notes (Signed)
Procedure Name: LMA Insertion Date/Time: 09/18/2011 10:33 AM Performed by: Maris Berger T Pre-anesthesia Checklist: Patient identified, Emergency Drugs available, Suction available and Patient being monitored Patient Re-evaluated:Patient Re-evaluated prior to inductionOxygen Delivery Method: Circle System Utilized Preoxygenation: Pre-oxygenation with 100% oxygen Intubation Type: IV induction Ventilation: Mask ventilation without difficulty LMA: LMA inserted LMA Size: 4.0 Number of attempts: 1 Placement Confirmation: positive ETCO2 Tube secured with: Tape Dental Injury: Teeth and Oropharynx as per pre-operative assessment

## 2011-09-18 NOTE — Op Note (Signed)
PATIENT:  [name]  PRE-OPERATIVE DIAGNOSIS: Bladder tumor  POST-OPERATIVE DIAGNOSIS: Same  PROCEDURE:  Procedure(s): TRANSURETHRAL RESECTION OF BLADDER TUMOR (TURBT) (1.5cm.)  SURGEON:  Surgeon(s): Garnett Farm  ANESTHESIA:   General  EBL:  Minimal  SPECIMEN:  Source of Specimen:  Bladder tumor  DISPOSITION OF SPECIMEN:  PATHOLOGY  Indication: Mrs. Huron is a 71 year old female who experienced gross painless hematuria with a negative urine culture and was evaluated with an upper tract study including CT scan which revealed no abnormalities. Cystoscopically I found a single papillary tumor on a narrow stalk on the right base of the bladder just lateral to the right ureteral orifice. She is brought to the operating room for treatment of the tumor.  Description of operation: The patient was taken to the operating room and administered general anesthesia. He was then placed on the table and moved to the dorsal lithotomy position after which his genitalia was sterilely prepped and draped. An official timeout was then performed.  The 26 French resectoscope with Timberlake obturator was then introduced into the bladder and the obturator was removed. The resectoscope element with 12 lens was then inserted and the bladder was fully and systematically inspected. Ureteral orifices were noted to be in the normal anatomic positions.   I first began by resecting the bladder tumor at its base. I noted a narrow stalk with an adjacent area of abnormal appearing mucosa. I resected the area of the stalk of the tumor as well as the abnormal area with care being taken to remain away from the ureteral orifice. Reinspection of the bladder revealed all obvious tumor had been fully resected and there was no evidence of perforation. The Microvasive evacuator was then used to irrigate the bladder and remove all of the portions of bladder tumor which were sent to pathology. I then removed the  resectoscope.  PLAN OF CARE: Discharge to home after PACU  PATIENT DISPOSITION:  PACU - hemodynamically stable.

## 2011-09-18 NOTE — Discharge Instructions (Addendum)
Transurethral Resection of Bladder Tumor (TURBT)   Definition:  Transurethral Resection of the Bladder Tumor is a surgical procedure used to diagnose and remove tumors within the bladder. TURBT is the most common treatment for early stage bladder cancer.  General instructions:     Your recent bladder surgery requires very little post hospital care but some definite precautions.  Despite the fact that no skin incisions were used, the area around the bladder incisions are raw and covered with scabs to promote healing and prevent bleeding. Certain precautions are needed to insure that the scabs are not disturbed over the next 2-4 weeks while the healing proceeds.  Because the raw surface inside your bladder and the irritating effects of urine you may expect frequency of urination and/or urgency (a stronger desire to urinate) and perhaps even getting up at night more often. This will usually resolve or improve slowly over the healing period. You may see some blood in your urine over the first 6 weeks. Do not be alarmed, even if the urine was clear for a while. Get off your feet and drink lots of fluids until clearing occurs. If you start to pass clots or don't improve call us.  Catheter: (If you are discharged with a catheter.)  1. Keep your catheter secured to your leg at all times with tape or the supplied strap. 2. You may experience leakage of urine around your catheter- as long as the  catheter continues to drain, this is normal.  If your catheter stops draining  go to the ER. 3. You may also have blood in your urine, even after it has been clear for  several days; you may even pass some small blood clots or other material.  This  is normal as well.  If this happens, sit down and drink plenty of water to help  make urine to flush out your bladder.  If the blood in your urine becomes worse  after doing this, contact our office or return to the ER. 4. You may use the leg bag (small bag)  during the day, but use the large bag at  night.  Diet:  You may return to your normal diet immediately. Because of the raw surface of your bladder, alcohol, spicy foods, foods high in acid and drinks with caffeine may cause irritation or frequency and should be used in moderation. To keep your urine flowing freely and avoid constipation, drink plenty of fluids during the day (8-10 glasses). Tip: Avoid cranberry juice because it is very acidic.  Activity:  Your physical activity doesn't need to be restricted. However, if you are very active, you may see some blood in the urine. We suggest that you reduce your activity under the circumstances until the bleeding has stopped.  Bowels:  It is important to keep your bowels regular during the postoperative period. Straining with bowel movements can cause bleeding. A bowel movement every other day is reasonable. Use a mild laxative if needed, such as milk of magnesia 2-3 tablespoons, or 2 Dulcolax tablets. Call if you continue to have problems. If you had been taking narcotics for pain, before, during or after your surgery, you may be constipated. Take a laxative if necessary.    Medication:  You should resume your pre-surgery medications unless told not to. In addition you may be given an antibiotic to prevent or treat infection. Antibiotics are not always necessary. All medication should be taken as prescribed until the bottles are finished unless you are having   an unusual reaction to one of the drugs.    Post Anesthesia Home Care Instructions  Activity: Get plenty of rest for the remainder of the day. A responsible adult should stay with you for 24 hours following the procedure.  For the next 24 hours, DO NOT: -Drive a car -Operate machinery -Drink alcoholic beverages -Take any medication unless instructed by your physician -Make any legal decisions or sign important papers.  Meals: Start with liquid foods such as gelatin or soup.  Progress to regular foods as tolerated. Avoid greasy, spicy, heavy foods. If nausea and/or vomiting occur, drink only clear liquids until the nausea and/or vomiting subsides. Call your physician if vomiting continues.  Special Instructions/Symptoms: Your throat may feel dry or sore from the anesthesia or the breathing tube placed in your throat during surgery. If this causes discomfort, gargle with warm salt water. The discomfort should disappear within 24 hours.        

## 2011-09-18 NOTE — Transfer of Care (Signed)
Immediate Anesthesia Transfer of Care Note  Patient: [name]  Procedure(s) Performed: Procedure(s) (LRB): TRANSURETHRAL RESECTION OF BLADDER TUMOR (TURBT) (N/A)  Patient Location: PACU  Anesthesia Type: General  Level of Consciousness: awake, alert  and oriented  Airway & Oxygen Therapy: Patient Spontanous Breathing and Patient connected to nasal cannula oxygen  Post-op Assessment: Report given to PACU RN  Post vital signs: Reviewed and stable  Complications: No apparent anesthesia complications

## 2011-09-18 NOTE — Anesthesia Postprocedure Evaluation (Signed)
Anesthesia Post Note  Patient: [name]  Procedure(s) Performed: Procedure(s) (LRB): TRANSURETHRAL RESECTION OF BLADDER TUMOR (TURBT) (N/A)  Anesthesia type: General  Patient location: PACU  Post pain: Pain level controlled  Post assessment: Post-op Vital signs reviewed  Last Vitals:  Filed Vitals:   09/18/11 1130  BP: 130/53  Pulse: 62  Temp:   Resp: 17    Post vital signs: Reviewed  Level of consciousness: sedated  Complications: No apparent anesthesia complications

## 2011-09-18 NOTE — Anesthesia Preprocedure Evaluation (Addendum)
Anesthesia Evaluation    History of Anesthesia Complications Negative for: history of anesthetic complications  Airway Mallampati: II TM Distance: >3 FB Neck ROM: Full    Dental  (+) Edentulous Upper, Poor Dentition and Partial Lower   Pulmonary shortness of breath and with exertion, COPDformer smoker (40 pack years; discontinued approx. 13 years ago) History chronic PE breath sounds clear to auscultation  Pulmonary exam normal       Cardiovascular Rhythm:Regular Rate:Normal     Neuro/Psych    GI/Hepatic   Endo/Other  Hypothyroidism   Renal/GU      Musculoskeletal   Abdominal   Peds  Hematology   Anesthesia Other Findings   Reproductive/Obstetrics                          Anesthesia Physical Anesthesia Plan  ASA: III  Anesthesia Plan: General   Post-op Pain Management:    Induction: Intravenous  Airway Management Planned: LMA  Additional Equipment:   Intra-op Plan:   Post-operative Plan: Extubation in OR  Informed Consent: I have reviewed the patients History and Physical, chart, labs and discussed the procedure including the risks, benefits and alternatives for the proposed anesthesia with the patient or authorized representative who has indicated his/her understanding and acceptance.   Dental advisory given  Plan Discussed with: CRNA  Anesthesia Plan Comments:         Anesthesia Quick Evaluation

## 2011-09-19 ENCOUNTER — Encounter (HOSPITAL_BASED_OUTPATIENT_CLINIC_OR_DEPARTMENT_OTHER): Payer: Self-pay | Admitting: Urology

## 2011-10-25 ENCOUNTER — Telehealth: Payer: Self-pay | Admitting: Pulmonary Disease

## 2011-10-25 NOTE — Telephone Encounter (Signed)
Office is now closed and we will need to call back in the morning.

## 2011-10-26 NOTE — Telephone Encounter (Signed)
[name] returned [name]'s call & can be reached at [phone].  Stated her pt hasn't shown up yet this AM...  Antionette Fairy

## 2011-10-26 NOTE — Telephone Encounter (Signed)
LM with receptionist to have Elpidio Anis to return triage call regarding this pt.

## 2011-10-26 NOTE — Telephone Encounter (Signed)
I spoke with Denny Peon and she states pt is currently on Carvedilol 12.5 MG bid-Since pt has COPD would Dr. Shelle Iron prefer they change this BP medication to bystolic since carvedilol can worsen pt lung function. Pt's BP is under control. I advised her Jonathon Bellows is out of the office until next Thursday. She was fine with this. Please advise KC, thanks

## 2011-11-02 NOTE — Telephone Encounter (Signed)
Always a hard question, especially if the pt is doing ok from a breathing standpoint, and her bp is controlled on her current regimen.  I would probably leave BP med alone for now unless the pt clearly is having more issues, or is not very satisfied with how her breathing is doing.  The other option is to put the question to the pt.... We can change your bp meds to see if your breathing does better, but may have issues with blood pressure control???  I am ok with either decision.

## 2011-11-03 NOTE — Telephone Encounter (Signed)
LMTCB for [employer]

## 2011-11-06 NOTE — Telephone Encounter (Signed)
Denny Peon is at a medical conference this week but I did speak with her nurse, Asher Muir. Asher Muir will relay Kc response and have Erin call back next week if she has any further questions regarding this patient.

## 2011-11-14 ENCOUNTER — Other Ambulatory Visit: Payer: Self-pay | Admitting: Pulmonary Disease

## 2011-11-14 MED ORDER — TIOTROPIUM BROMIDE MONOHYDRATE 18 MCG IN CAPS: 18.0000 ug | ORAL_CAPSULE | Freq: Every day | RESPIRATORY_TRACT | Status: DC

## 2011-11-14 NOTE — Telephone Encounter (Signed)
Other refill encounter dated 11/14/11 signed in error  Faxed refill request received for Spiriva from [facility] mail order pharmacy.  Patient last seen 08/25/11. Pending appointment for 03/01/12. Refill sent in.

## 2011-11-15 ENCOUNTER — Telehealth: Payer: Self-pay | Admitting: Pulmonary Disease

## 2011-11-15 MED ORDER — TIOTROPIUM BROMIDE MONOHYDRATE 18 MCG IN CAPS: 18.0000 ug | ORAL_CAPSULE | Freq: Every day | RESPIRATORY_TRACT | Status: DC

## 2011-11-15 NOTE — Telephone Encounter (Signed)
I spoke with pt and is [facility] [address] [facility] [address] bc this is closer to her. I have sent rx and nothing further was needed

## 2012-01-05 ENCOUNTER — Other Ambulatory Visit: Payer: Self-pay | Admitting: *Deleted

## 2012-01-05 MED ORDER — FORMOTEROL FUMARATE 12 MCG IN CAPS: 12.0000 ug | ORAL_CAPSULE | Freq: Two times a day (BID) | RESPIRATORY_TRACT | Status: DC

## 2012-01-31 ENCOUNTER — Telehealth: Payer: Self-pay | Admitting: Pulmonary Disease

## 2012-01-31 MED ORDER — FORMOTEROL FUMARATE 12 MCG IN CAPS: 12.0000 ug | ORAL_CAPSULE | Freq: Two times a day (BID) | RESPIRATORY_TRACT | Status: DC

## 2012-01-31 NOTE — Telephone Encounter (Signed)
Pt informed that [facility].

## 2012-03-01 ENCOUNTER — Encounter: Payer: Self-pay | Admitting: Pulmonary Disease

## 2012-03-01 ENCOUNTER — Ambulatory Visit (INDEPENDENT_AMBULATORY_CARE_PROVIDER_SITE_OTHER): Payer: Medicare Other | Admitting: Pulmonary Disease

## 2012-03-01 VITALS — BP 110/66 | HR 75 | Temp 97.3°F | Ht 63.0 in | Wt 132.0 lb

## 2012-03-01 DIAGNOSIS — Z23 Encounter for immunization: Secondary | ICD-10-CM

## 2012-03-01 DIAGNOSIS — J449 Chronic obstructive pulmonary disease, unspecified: Secondary | ICD-10-CM

## 2012-03-01 NOTE — Assessment & Plan Note (Signed)
The patient is doing well from a pulmonary standpoint, and I have asked her to continue on her current bronchodilator regimen.  I also stressed to her the importance of consistent activities to build her conditioning.  I will give the patient a flu shot today, and she is to followup with me in 6 months if doing well.

## 2012-03-01 NOTE — Patient Instructions (Addendum)
Stay on spiriva and foradil Keep albuterol available to use for emergencies 2 inhalations every 6 hrs if needed. Will give you the flu shot today. Stay as active as possible, and followup with me 6mos.

## 2012-03-01 NOTE — Progress Notes (Signed)
  Subjective:    Patient ID: [name], female    DOB: 1940-06-24, 71 y.o.   MRN: [id number]  HPI Patient comes in today for followup of her known COPD.  She has been doing very well since the last visit, and got through her recent surgery without issues.  She denies any significant cough or congestion, and feels that her exertional tolerance is at baseline.  She is staying on her medications compliantly, but does need a rescue inhaler.   Review of Systems  Constitutional: Negative for fever and unexpected weight change.  HENT: Negative for ear pain, nosebleeds, congestion, sore throat, rhinorrhea, sneezing, trouble swallowing, dental problem, postnasal drip and sinus pressure.   Eyes: Negative for redness and itching.  Respiratory: Positive for cough and shortness of breath. Negative for chest tightness and wheezing.   Cardiovascular: Negative for palpitations and leg swelling.  Gastrointestinal: Negative for nausea and vomiting.  Genitourinary: Negative for dysuria.  Musculoskeletal: Negative for joint swelling.  Skin: Negative for rash.  Neurological: Negative for headaches.  Hematological: Does not bruise/bleed easily.  Psychiatric/Behavioral: Negative for dysphoric mood. The patient is not nervous/anxious.        Objective:   Physical Exam Well-developed female in no acute distress Nose without purulence or discharge noted Neck without lymphadenopathy or thyromegaly Chest with decreased breath sounds, no wheezing Cardiac exam with regular rate and rhythm Lower extremities without edema, no cyanosis Alert and oriented, moves all 4 extremities.       Assessment & Plan:

## 2012-04-23 ENCOUNTER — Telehealth: Payer: Self-pay | Admitting: Pulmonary Disease

## 2012-04-23 MED ORDER — TIOTROPIUM BROMIDE MONOHYDRATE 18 MCG IN CAPS: 18.0000 ug | ORAL_CAPSULE | Freq: Every day | RESPIRATORY_TRACT | Status: DC

## 2012-04-23 NOTE — Telephone Encounter (Signed)
Rx has been sent in, pt is aware. 

## 2012-06-24 IMAGING — CR DG CHEST 1V
1 series · 1 of 1 positions shown · non-contrast
Comparison: Two-view chest x-ray 01/15/2009.  CT chest 06/28/2009.

CLINICAL DATA: Fall.  Comminuted right humeral neck fracture.
Preoperative respiratory evaluation.  Former smoker with history of
COPD.

CHEST - 1 VIEW 05/06/2010:

[t chest supine]
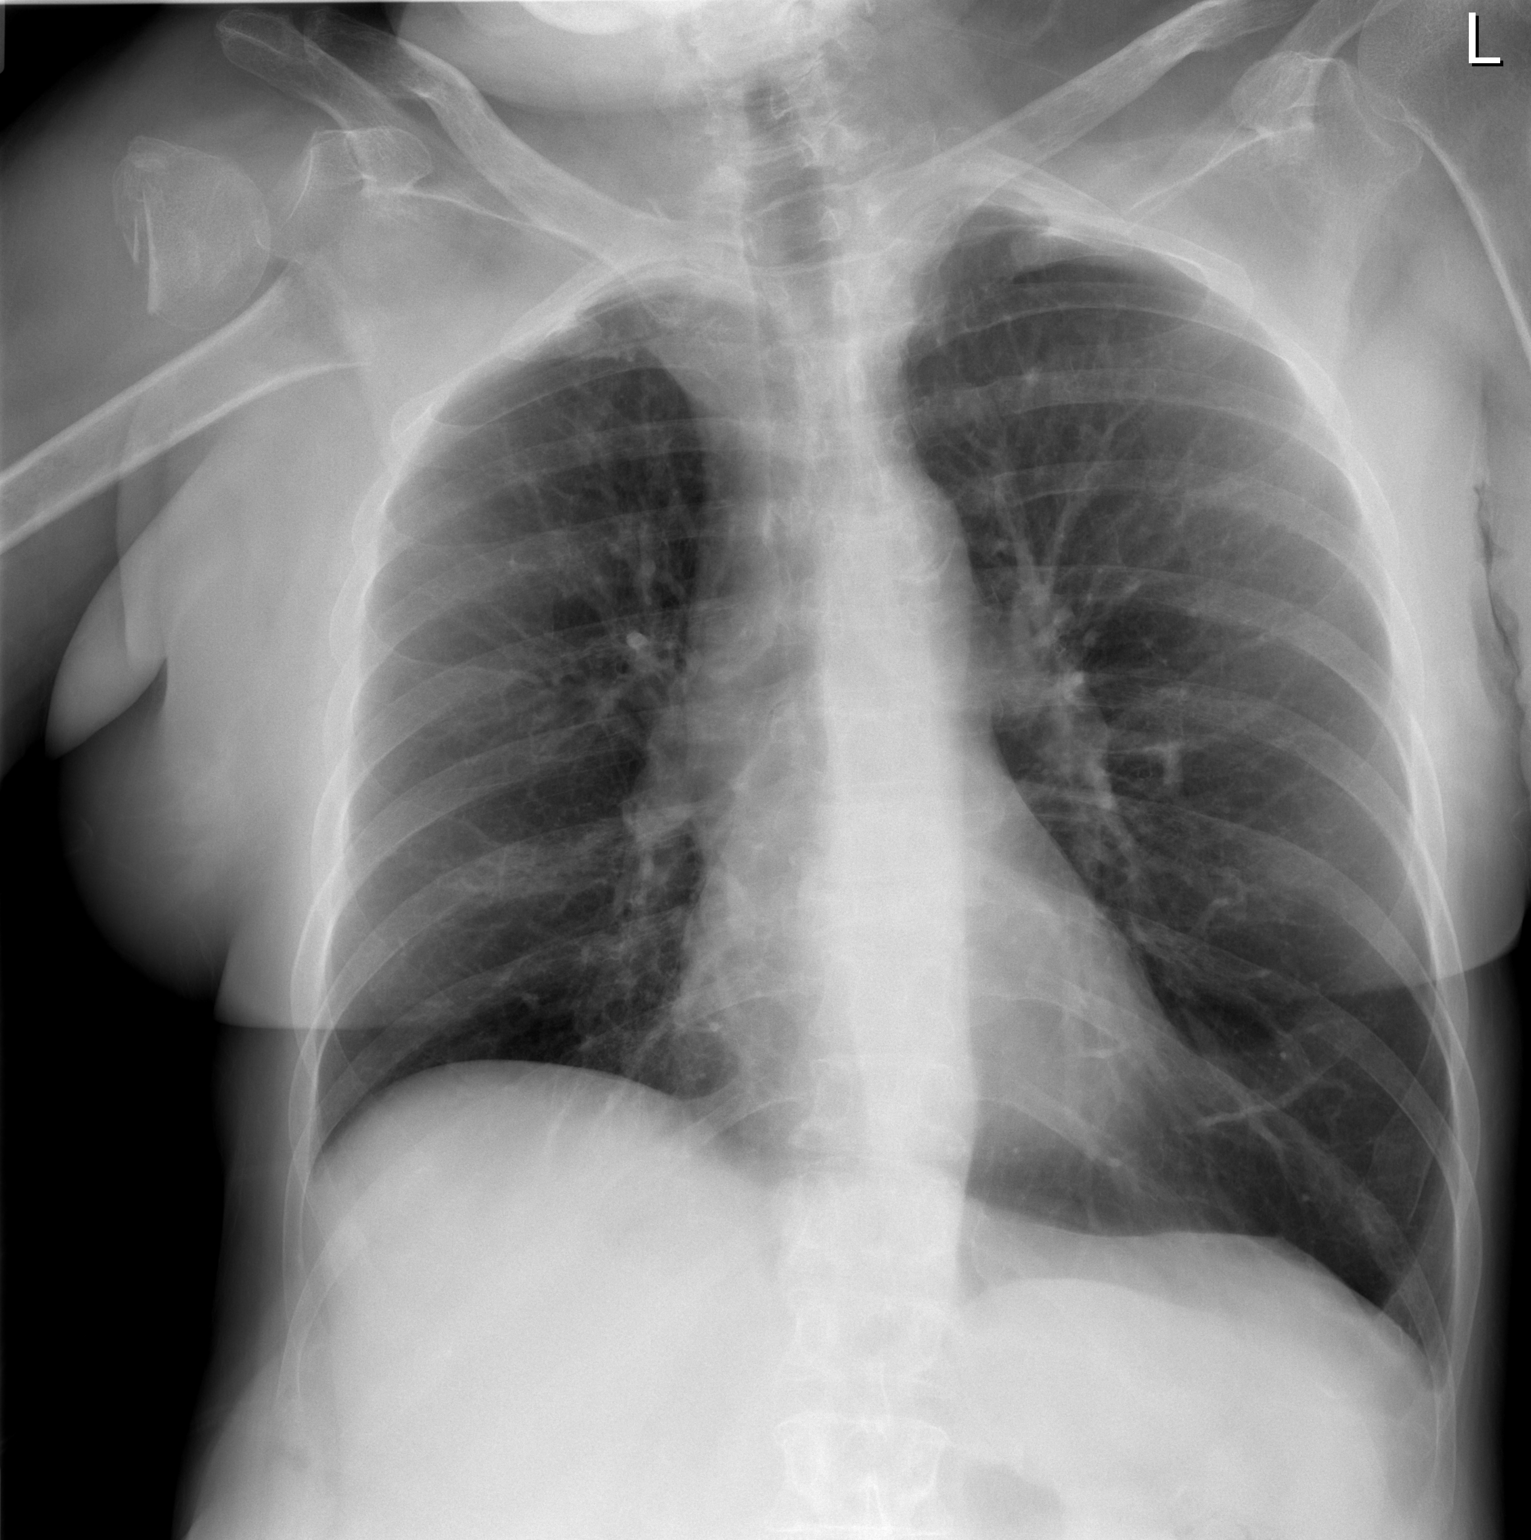

[1 of 1 positions shown; findings below may reference images not displayed]

FINDINGS: Cardiac silhouette normal in size, unchanged.  Thoracic
aorta atherosclerotic, unchanged.  Hilar and mediastinal contours
otherwise.  Lungs hyperinflated but clear.  Mild chronic elevation
of the right hemidiaphragm.  Comminuted, displaced right humeral
neck fracture noted.
IMPRESSION: COPD/emphysema.  No acute cardiopulmonary disease.  Comminuted
right humeral neck fracture.

## 2012-06-24 IMAGING — CR DG TIBIA/FIBULA 2V*R*
1 series · 1 of 1 positions shown · non-contrast
Comparison: None.

CLINICAL DATA: Right lower leg bruising and swelling secondary to a
fall down stairs today.

RIGHT TIBIA AND FIBULA - 2 VIEW

[view not recorded]
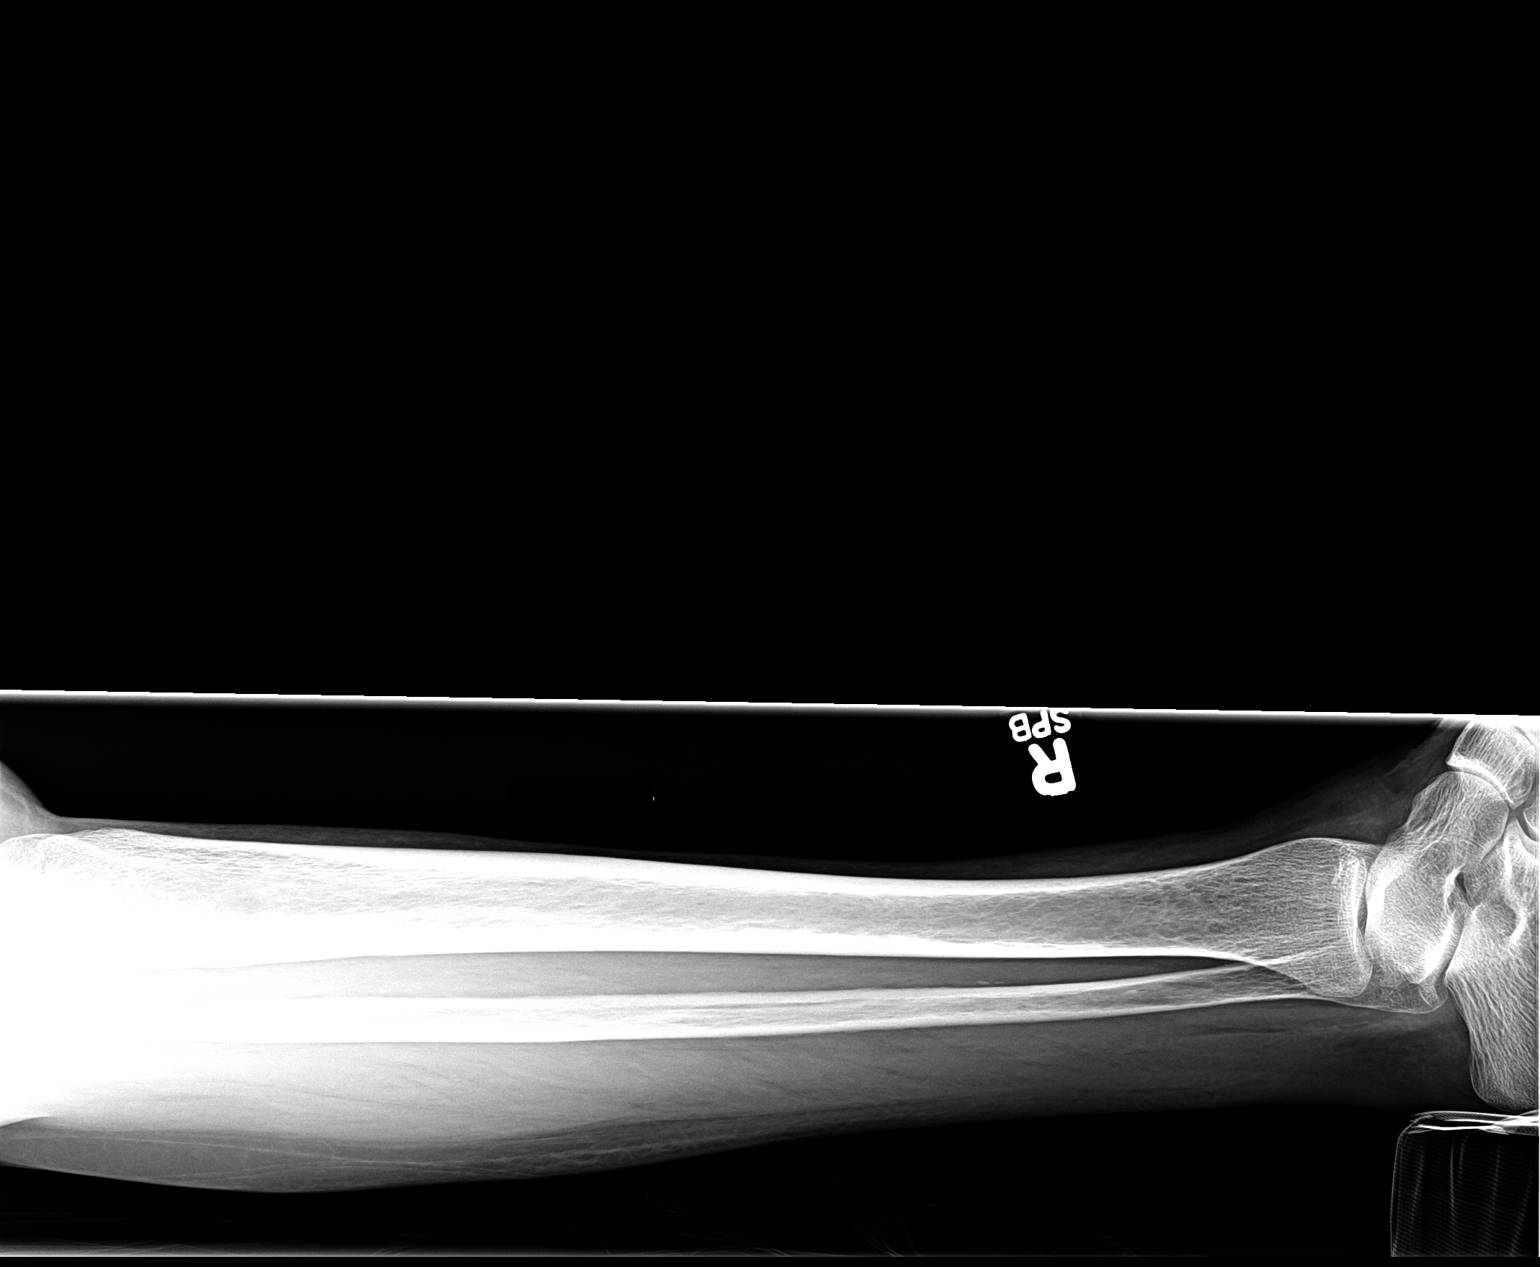

[1 of 1 positions shown; findings below may reference images not displayed]

FINDINGS: There is no fracture or dislocation or other abnormality.
IMPRESSION: Normal exam.

## 2012-06-25 IMAGING — RF DG HIP OPERATIVE*R*
1 series · 4 of 4 positions shown · non-contrast
Comparison: 05/06/2010.

CLINICAL DATA: 69-year-old female undergoing ORIF right femur.

OPERATIVE RIGHT HIP

[Series 1: run · 4 of 4 slices shown]
[im 1/4]
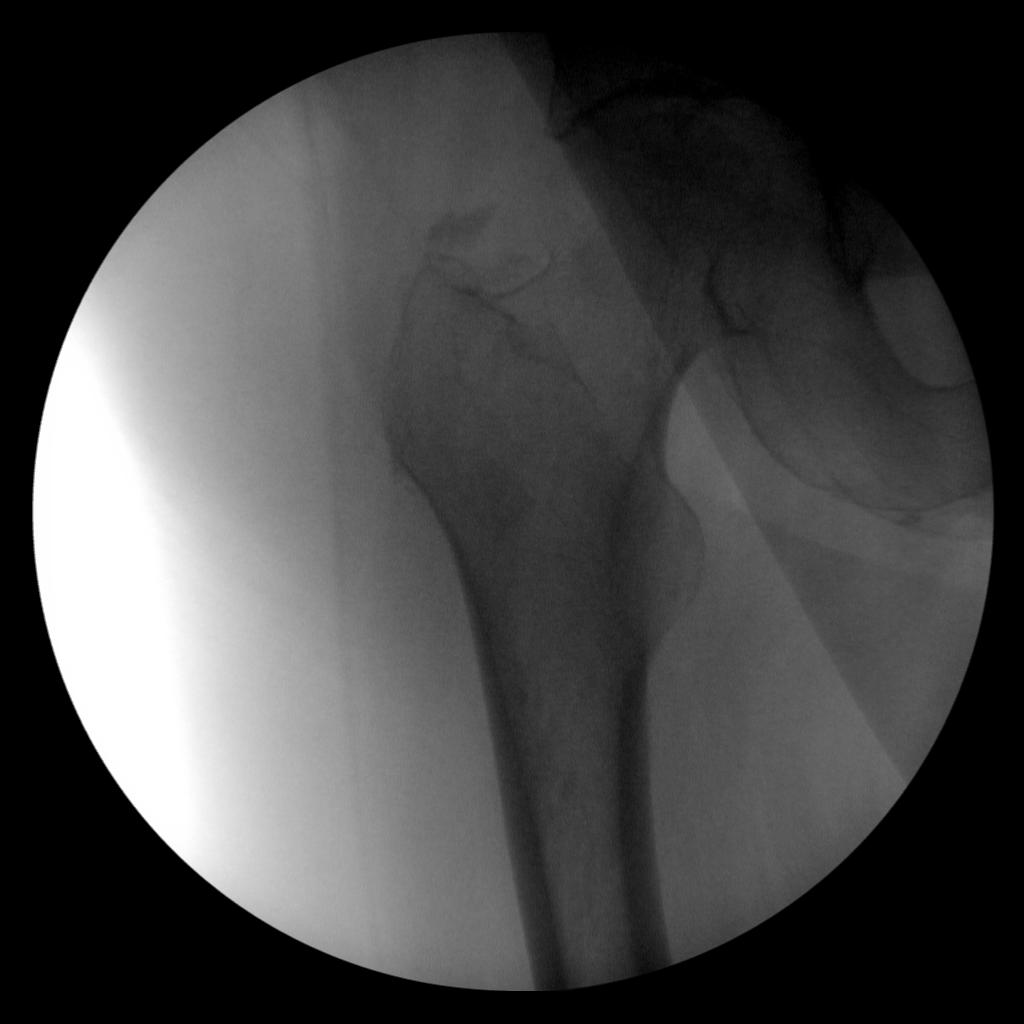
[im 2/4]
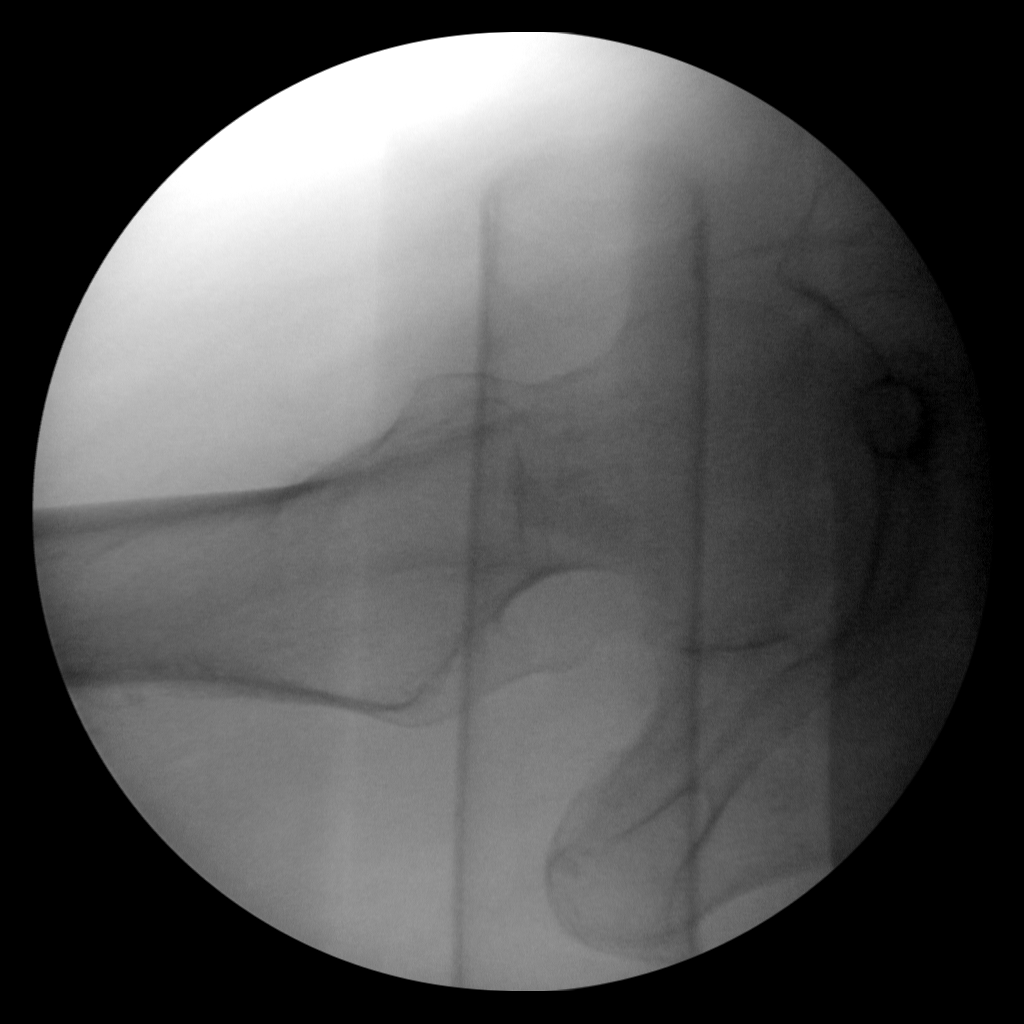
[im 3/4]
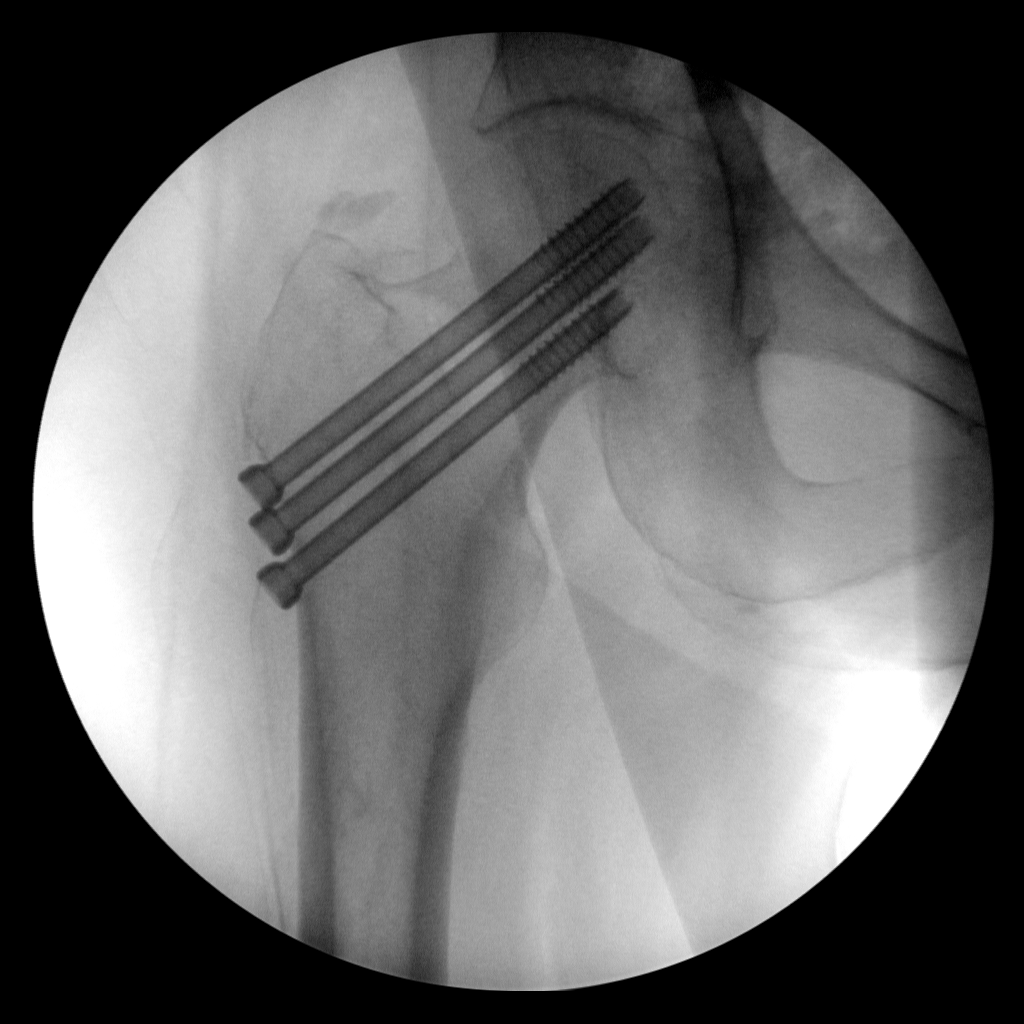
[im 4/4]
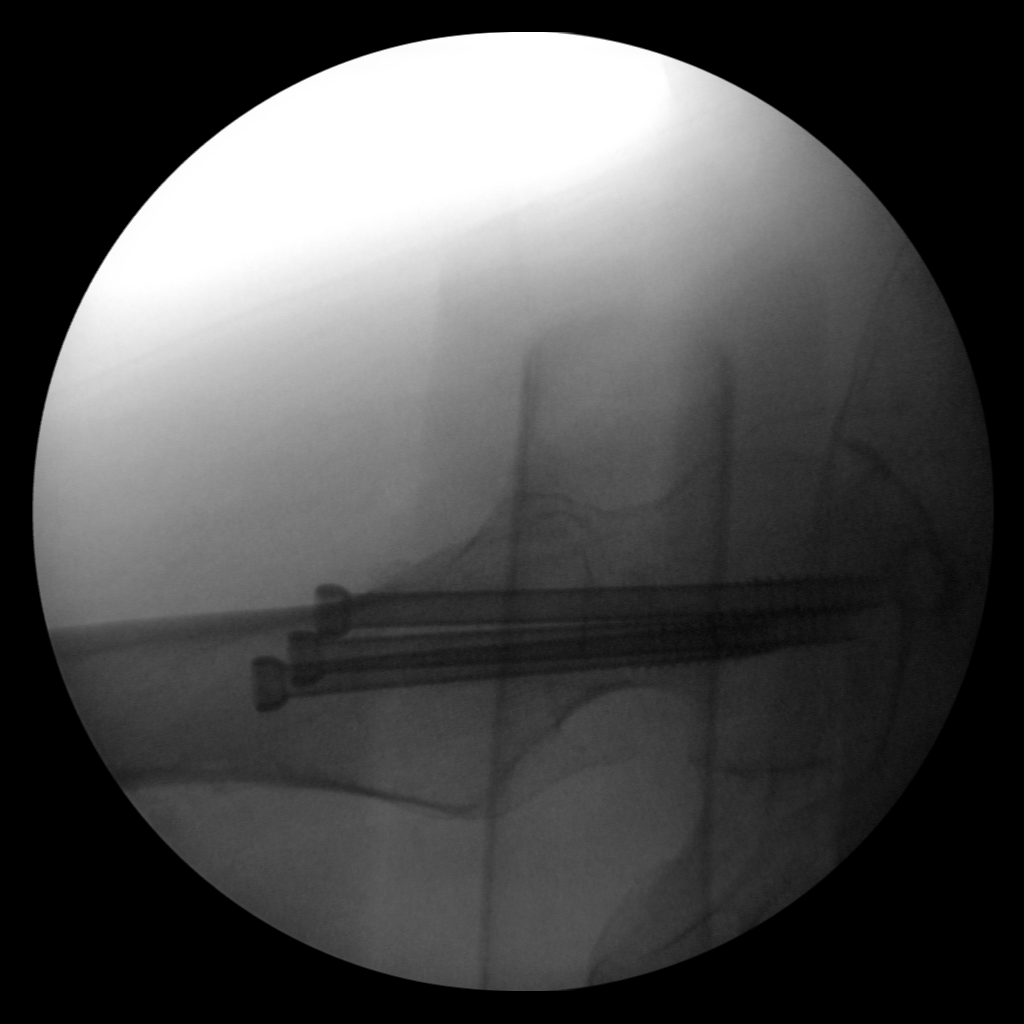

[4 of 4 positions shown; findings below may reference images not displayed]

FINDINGS: Four intraoperative fluoroscopic spot views of the
proximal right femur.  Three cannulated screws traverse the right
femoral neck and appear intact.  Alignment appears anatomic.
IMPRESSION: ORIF right proximal femur with no adverse features.

## 2012-06-29 IMAGING — CR DG CHEST 2V
2 series · 2 of 2 positions shown · non-contrast
Comparison: Chest 05/08/2010.

CLINICAL DATA: Hypoxia and abdominal pain.  Fall.

CHEST - 2 VIEW

[w chest lat]
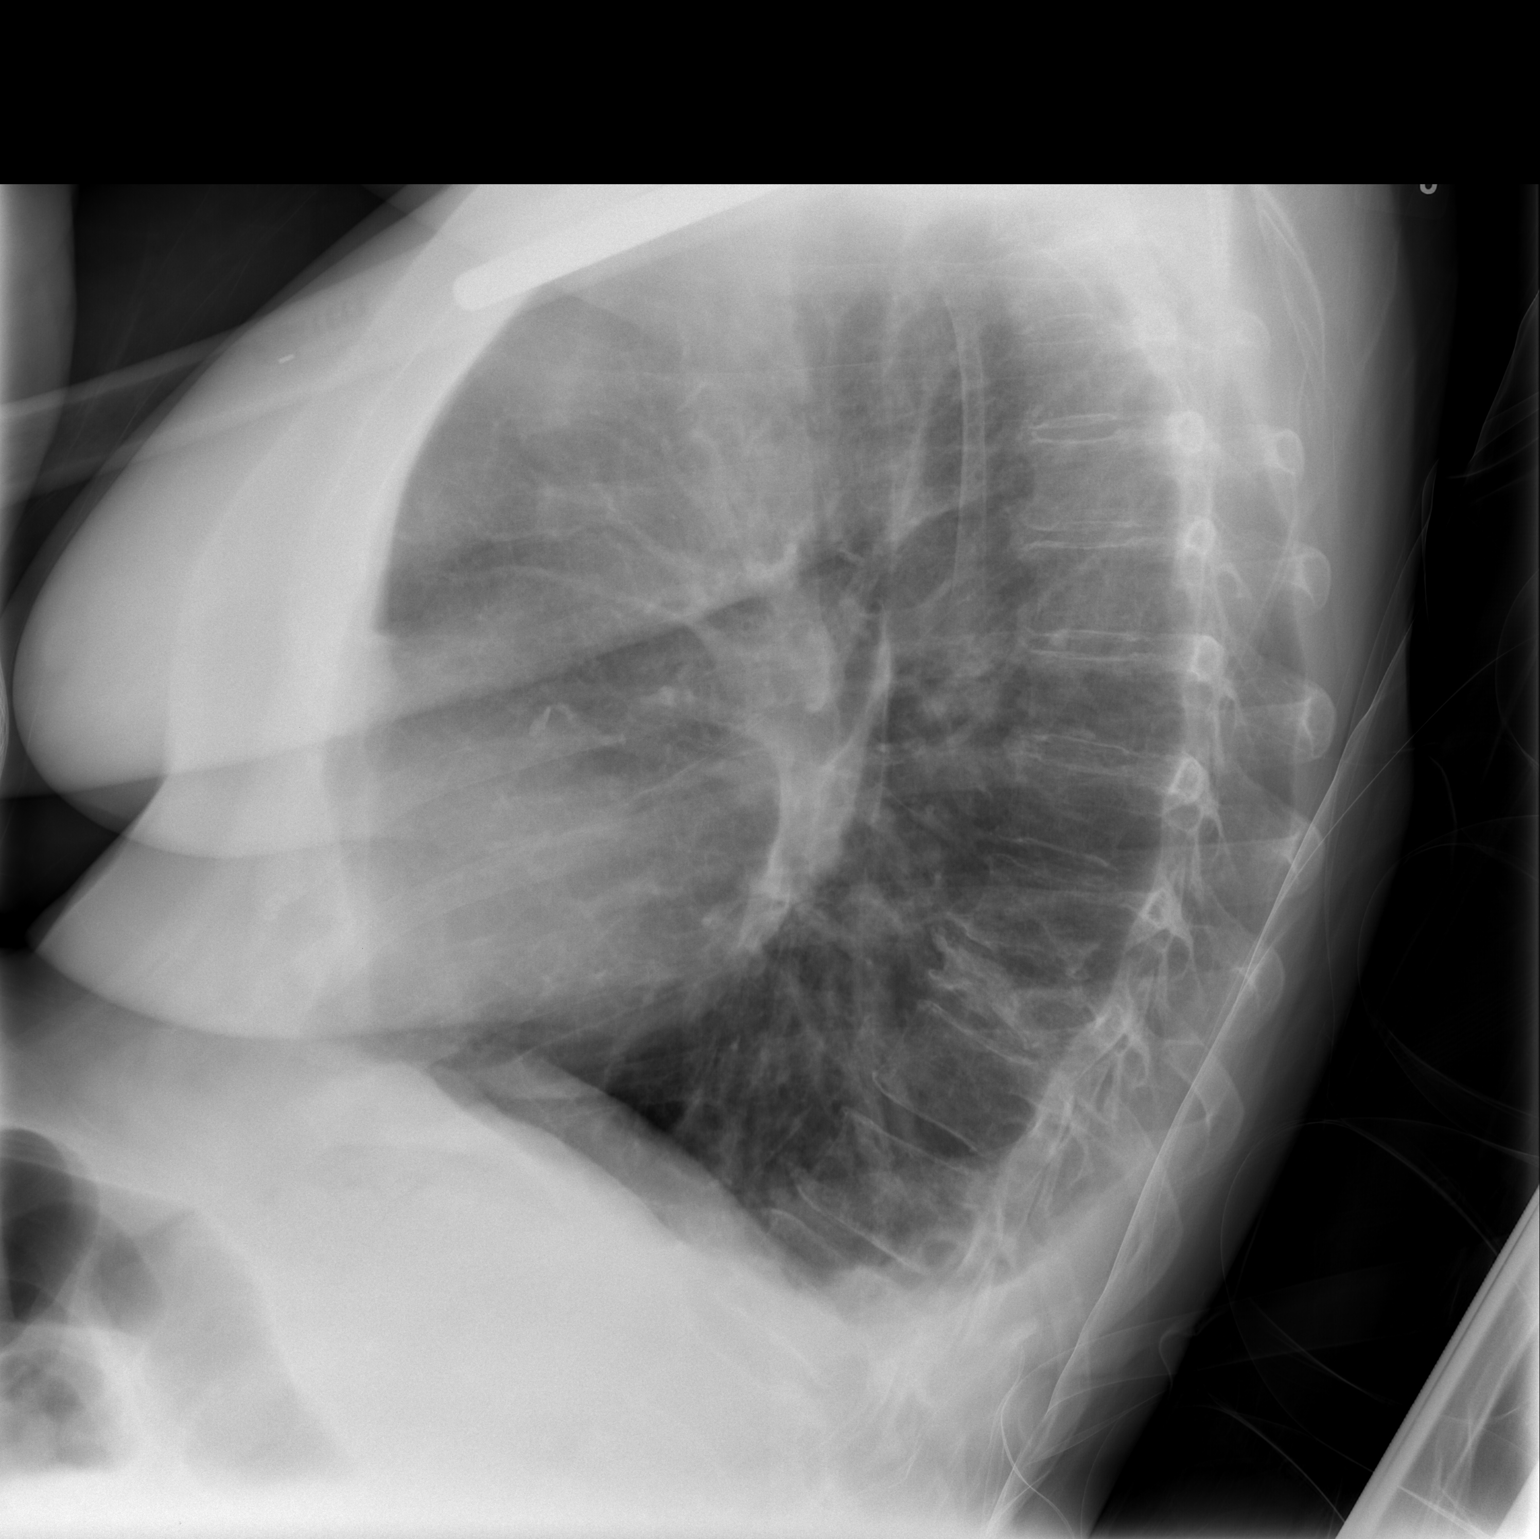

[view not recorded]
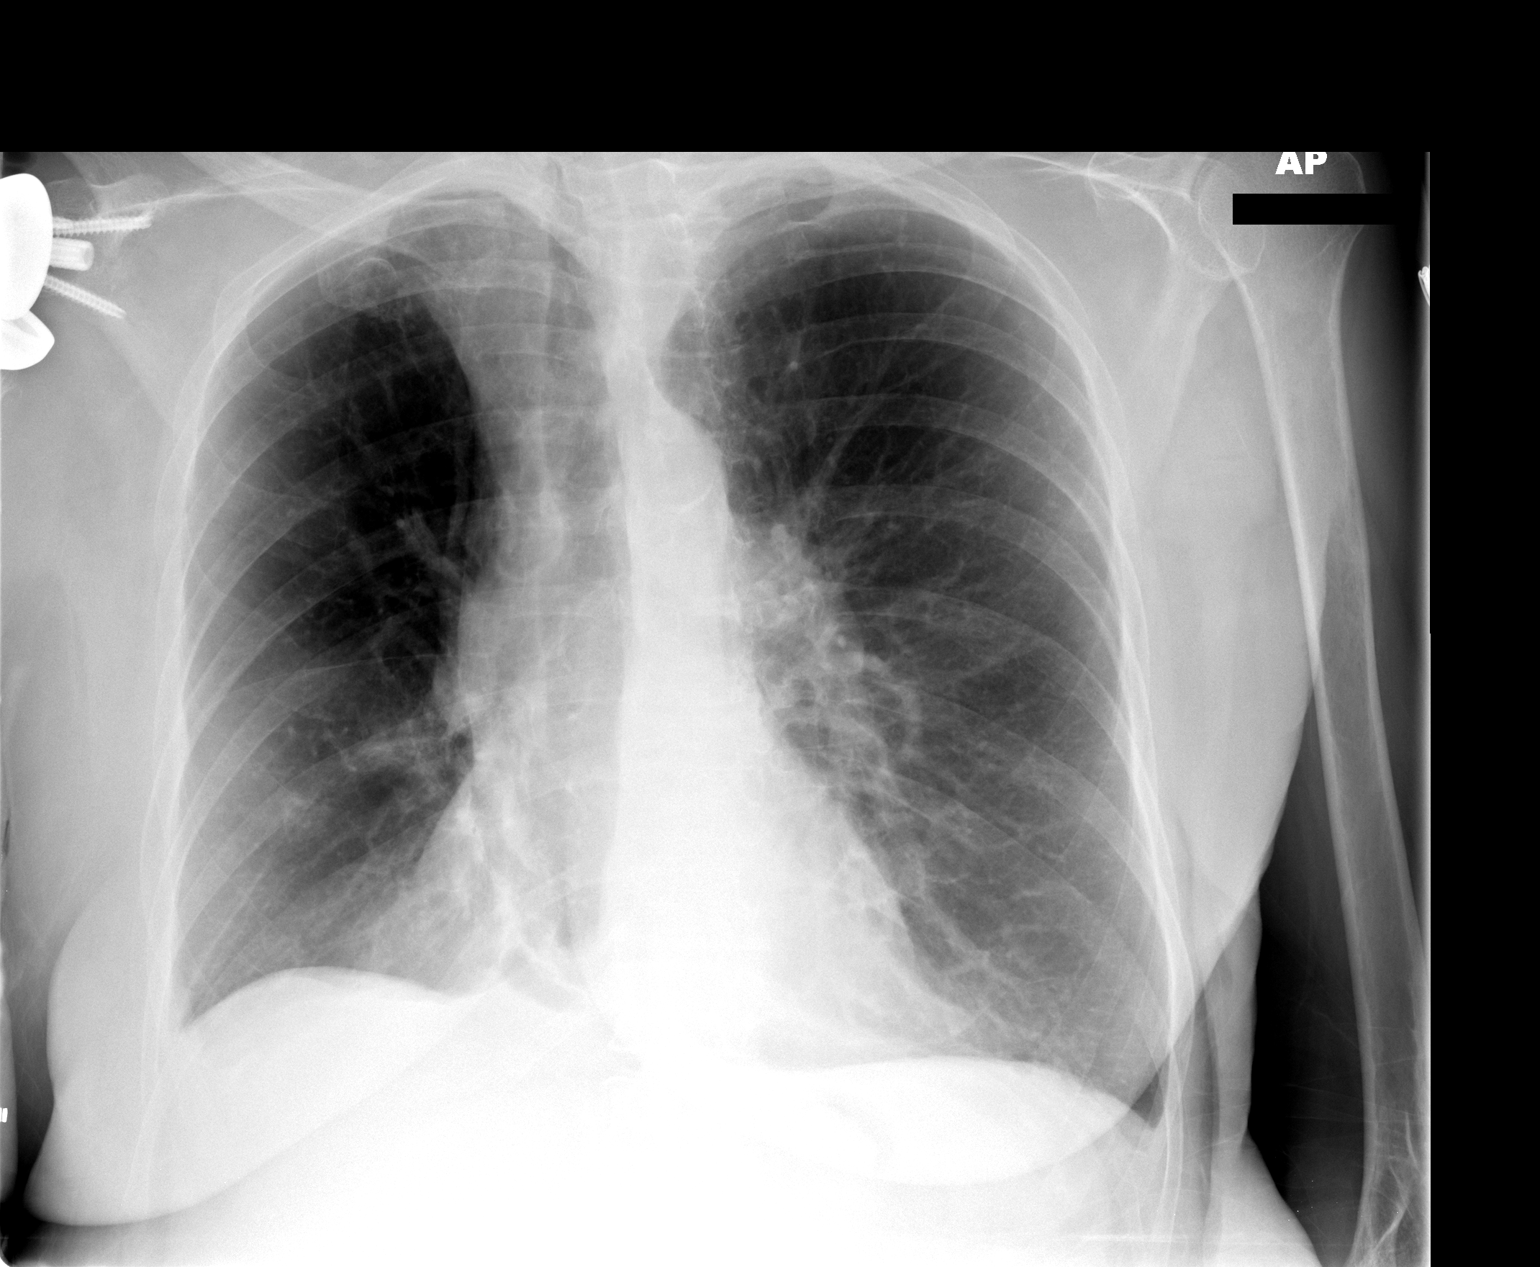

[2 of 2 positions shown; findings below may reference images not displayed]

FINDINGS: The patient has small bilateral pleural effusions.  The
chest is hyperexpanded compatible with emphysema.  No consolidative
process is identified.  Heart size is normal.
IMPRESSION: 1.  Small bilateral pleural effusions.
2.  COPD.

## 2012-08-30 ENCOUNTER — Encounter: Payer: Self-pay | Admitting: Pulmonary Disease

## 2012-08-30 ENCOUNTER — Ambulatory Visit (INDEPENDENT_AMBULATORY_CARE_PROVIDER_SITE_OTHER): Payer: Medicare Other | Admitting: Pulmonary Disease

## 2012-08-30 VITALS — BP 102/66 | HR 70 | Temp 97.1°F | Ht 62.5 in | Wt 126.6 lb

## 2012-08-30 DIAGNOSIS — J449 Chronic obstructive pulmonary disease, unspecified: Secondary | ICD-10-CM

## 2012-08-30 NOTE — Patient Instructions (Addendum)
Continue on current breathing medications. Work on exercise program/conditioning, and consider pulmonary rehab referral.  followup with me in 6mos if doing well.

## 2012-08-30 NOTE — Progress Notes (Signed)
  Subjective:    Patient ID: [name], female    DOB: [birth year], 72 y.o.   MRN: [id number]  HPI The patient comes in today for followup of her known COPD.  She is staying on her bronchodilator regimen compliantly, and feels that her exertional tolerance is at baseline.  She has not had a pulmonary infection or acute exacerbation since her last visit.  She is trying to stay as active as possible with an exercise program at home.   Review of Systems  Constitutional: Negative for fever and unexpected weight change.  HENT: Negative for ear pain, nosebleeds, congestion, sore throat, rhinorrhea, sneezing, trouble swallowing, dental problem, postnasal drip and sinus pressure.   Eyes: Negative for redness and itching.  Respiratory: Positive for shortness of breath. Negative for cough, chest tightness and wheezing.   Cardiovascular: Negative for palpitations and leg swelling.  Gastrointestinal: Negative for nausea and vomiting.  Genitourinary: Negative for dysuria.  Musculoskeletal: Negative for joint swelling.  Skin: Positive for rash ( ithcing).  Neurological: Negative for headaches.  Hematological: Does not bruise/bleed easily.  Psychiatric/Behavioral: Negative for dysphoric mood. The patient is not nervous/anxious.        Objective:   Physical Exam Well-developed female in no acute distress Nose without purulent discharge noted Neck without lymphadenopathy or thyromegaly Chest with mildly decreased breath sounds, no wheezes noted Cardiac exam with regular rate and rhythm Lower extremities without edema, no cyanosis Alert and oriented, moves all 4 extremities.       Assessment & Plan:

## 2012-08-30 NOTE — Assessment & Plan Note (Signed)
The patient appears to be doing well from a pulmonary standpoint, and feels that she is at her usual baseline.  I have asked her to continue on her current regimen, and to work aggressively on an exercise program.  I have offered to refer her to pulmonary rehabilitation, and she will think about it.

## 2012-09-04 ENCOUNTER — Other Ambulatory Visit: Payer: Self-pay | Admitting: Pulmonary Disease

## 2012-09-06 ENCOUNTER — Telehealth: Payer: Self-pay | Admitting: Pulmonary Disease

## 2012-09-06 MED ORDER — FORMOTEROL FUMARATE 12 MCG IN CAPS: 12.0000 ug | ORAL_CAPSULE | Freq: Two times a day (BID) | RESPIRATORY_TRACT | Status: DC

## 2012-09-06 NOTE — Telephone Encounter (Signed)
I spoke with pt and is aware RX has been sent. Nothing further was needed.  

## 2012-09-16 ENCOUNTER — Other Ambulatory Visit: Payer: Self-pay | Admitting: Pulmonary Disease

## 2013-02-10 ENCOUNTER — Other Ambulatory Visit: Payer: Self-pay | Admitting: Dermatology

## 2013-02-11 ENCOUNTER — Other Ambulatory Visit: Payer: Self-pay | Admitting: Pulmonary Disease

## 2013-03-07 ENCOUNTER — Ambulatory Visit (INDEPENDENT_AMBULATORY_CARE_PROVIDER_SITE_OTHER): Payer: Medicare Other | Admitting: Pulmonary Disease

## 2013-03-07 ENCOUNTER — Encounter: Payer: Self-pay | Admitting: Pulmonary Disease

## 2013-03-07 VITALS — BP 124/66 | HR 84 | Temp 97.6°F | Ht 62.0 in | Wt 131.0 lb

## 2013-03-07 DIAGNOSIS — J449 Chronic obstructive pulmonary disease, unspecified: Secondary | ICD-10-CM

## 2013-03-07 NOTE — Assessment & Plan Note (Signed)
The patient appears to be stable from a COPD standpoint.  I have asked her to continue on her current bronchodilator regimen, and to find some way to work on conditioning.  She has already had the flu shot this year, and I will see her back in 6 months.

## 2013-03-07 NOTE — Progress Notes (Signed)
  Subjective:    Patient ID: KANCHAN GAL, female    DOB: 1940-09-20, 72 y.o.   MRN: [id number]  HPI The patient comes in today for followup of her known COPD.  She is staying on her bronchodilator regimen, and has not had an acute exacerbation since last visit.  She feels that her exertional tolerance is at baseline, and unfortunately she has not attempted some type of conditioning program.   Review of Systems  Constitutional: Negative for fever and unexpected weight change.  HENT: Negative for congestion, dental problem, ear pain, nosebleeds, postnasal drip, rhinorrhea, sinus pressure, sneezing, sore throat and trouble swallowing.   Eyes: Negative for redness and itching.  Respiratory: Positive for shortness of breath. Negative for cough, chest tightness and wheezing.   Cardiovascular: Negative for palpitations and leg swelling.  Gastrointestinal: Negative for nausea and vomiting.  Genitourinary: Negative for dysuria.  Musculoskeletal: Negative for joint swelling.  Skin: Negative for rash.  Neurological: Negative for headaches.  Hematological: Does not bruise/bleed easily.  Psychiatric/Behavioral: Negative for dysphoric mood. The patient is not nervous/anxious.        Objective:   Physical Exam Well-developed female in no acute distress Nose without purulence or discharge noted Neck without lymphadenopathy or thyromegaly Chest with decreased breath sounds, no active wheezing Cardiac exam with regular rate and rhythm Lower extremities with minimal edema, no cyanosis Alert and oriented, moves all 4 extremities.       Assessment & Plan:

## 2013-03-07 NOTE — Patient Instructions (Signed)
Will fill out a handicap parking form for you. No change in medication Try and stay as active as possible.  followup with me in 6mos.

## 2013-04-02 ENCOUNTER — Other Ambulatory Visit: Payer: Self-pay | Admitting: Pulmonary Disease

## 2013-07-14 ENCOUNTER — Other Ambulatory Visit: Payer: Self-pay | Admitting: Pulmonary Disease

## 2013-07-16 ENCOUNTER — Telehealth: Payer: Self-pay | Admitting: Pulmonary Disease

## 2013-07-16 MED ORDER — TIOTROPIUM BROMIDE MONOHYDRATE 18 MCG IN CAPS: ORAL_CAPSULE | RESPIRATORY_TRACT | Status: DC

## 2013-07-16 NOTE — Telephone Encounter (Signed)
Called spoke with pt. Aware RX has been sent. Nothing further needed 

## 2013-09-05 ENCOUNTER — Ambulatory Visit: Payer: Medicare Other | Admitting: Pulmonary Disease

## 2013-09-23 ENCOUNTER — Telehealth: Payer: Self-pay | Admitting: Pulmonary Disease

## 2013-09-23 ENCOUNTER — Encounter: Payer: Self-pay | Admitting: Pulmonary Disease

## 2013-09-23 ENCOUNTER — Ambulatory Visit (INDEPENDENT_AMBULATORY_CARE_PROVIDER_SITE_OTHER): Payer: Medicare Other | Admitting: Pulmonary Disease

## 2013-09-23 VITALS — BP 112/68 | HR 75 | Temp 97.1°F | Ht 63.0 in | Wt 126.8 lb

## 2013-09-23 DIAGNOSIS — J438 Other emphysema: Secondary | ICD-10-CM

## 2013-09-23 DIAGNOSIS — J439 Emphysema, unspecified: Secondary | ICD-10-CM

## 2013-09-23 NOTE — Assessment & Plan Note (Signed)
The patient feels that she is at her stable baseline, and has not had any increase recently in her pulmonary symptoms.  I have asked her to continue on her current medications, and to try and stay as active as possible.  I will see her back in 6 months if she is doing well.

## 2013-09-23 NOTE — Patient Instructions (Signed)
No change in your breathing medications. Stay as active as you can. followup with me again in 53mos.

## 2013-09-23 NOTE — Telephone Encounter (Signed)
Pt seen today 09/23/13 by [facility]. Pt reports PCP wants her to have colonoscopy and was suppose to ask [place] if she can be put sleep or not. She forgot to do so Please advise [place] thanks

## 2013-09-23 NOTE — Telephone Encounter (Signed)
[place] for conscious sedation, but will have to be monitored very closely

## 2013-09-23 NOTE — Telephone Encounter (Signed)
I called made pt aware of the below. Nothing further needed

## 2013-09-23 NOTE — Progress Notes (Signed)
   Subjective:    Patient ID: [name], female    DOB: January 10, 1941, 73 y.o.   MRN: [id number]  HPI The patient comes in today for followup of her known COPD. She is staying on her bronchodilator regimen, but has not been as active because she is being bothered by a skin condition. She has not had any increase in dyspnea, and no acute exacerbations. She denies any significant cough or mucus production.   Review of Systems  Constitutional: Positive for activity change and fatigue. Negative for fever and unexpected weight change.  HENT: Negative for congestion, dental problem, ear pain, nosebleeds, postnasal drip, rhinorrhea, sinus pressure, sneezing, sore throat and trouble swallowing.   Eyes: Negative for redness and itching.  Respiratory: Negative for cough, chest tightness, shortness of breath and wheezing.   Cardiovascular: Negative for palpitations and leg swelling.  Gastrointestinal: Negative for nausea and vomiting.  Genitourinary: Negative for dysuria.  Musculoskeletal: Negative for joint swelling.  Skin: Negative for rash.       Recently dx with Lichen Sclerosis  Neurological: Negative for headaches.  Hematological: Does not bruise/bleed easily.  Psychiatric/Behavioral: Negative for dysphoric mood. The patient is not nervous/anxious.        Objective:   Physical Exam Thin female in no acute distress Nose without purulence or discharge noted Neck without lymphadenopathy or thyromegaly Chest with mildly decreased breath sounds, no active wheezing or crackles Heart exam with regular rate and rhythm Lower extremities without edema, no cyanosis Alert and oriented, moves all 4 extremities.       Assessment & Plan:

## 2013-10-24 ENCOUNTER — Telehealth: Payer: Self-pay | Admitting: Gastroenterology

## 2013-10-24 ENCOUNTER — Other Ambulatory Visit: Payer: Self-pay | Admitting: Pulmonary Disease

## 2013-10-29 ENCOUNTER — Encounter: Payer: Self-pay | Admitting: Gastroenterology

## 2013-10-31 ENCOUNTER — Telehealth: Payer: Self-pay | Admitting: Pulmonary Disease

## 2013-10-31 NOTE — Telephone Encounter (Signed)
Spoke with the pt  She states that she usually takes foradil and spiriva at 9 am  Her appt for colonoscopy in Sept is at 8 am  I advised that it would be fine for her to take these meds prior to this appt  She verbalized understanding  Nothing further needed

## 2013-12-10 ENCOUNTER — Other Ambulatory Visit: Payer: Self-pay | Admitting: Pulmonary Disease

## 2013-12-17 ENCOUNTER — Ambulatory Visit (AMBULATORY_SURGERY_CENTER): Payer: Self-pay | Admitting: *Deleted

## 2013-12-17 VITALS — Ht 63.0 in | Wt 129.8 lb

## 2013-12-17 DIAGNOSIS — Z86010 Personal history of colonic polyps: Secondary | ICD-10-CM

## 2013-12-17 MED ORDER — MOVIPREP 100 G PO SOLR: 1.0000 | Freq: Once | ORAL | Status: DC

## 2013-12-17 NOTE — Progress Notes (Signed)
No home 02 use, pt has COPD. ewm No past problems with sedation except 05-07-2010 has issues with 02 sats but from trauma surgery. emw No e mail-- no emmi video. ewm

## 2013-12-31 ENCOUNTER — Ambulatory Visit (AMBULATORY_SURGERY_CENTER): Payer: Medicare Other | Admitting: Gastroenterology

## 2013-12-31 ENCOUNTER — Encounter: Payer: Self-pay | Admitting: Gastroenterology

## 2013-12-31 VITALS — BP 120/52 | HR 68 | Temp 97.0°F | Resp 20 | Ht 63.0 in | Wt 129.0 lb

## 2013-12-31 DIAGNOSIS — D125 Benign neoplasm of sigmoid colon: Secondary | ICD-10-CM

## 2013-12-31 DIAGNOSIS — Z86010 Personal history of colonic polyps: Secondary | ICD-10-CM

## 2013-12-31 DIAGNOSIS — D122 Benign neoplasm of ascending colon: Secondary | ICD-10-CM

## 2013-12-31 DIAGNOSIS — D123 Benign neoplasm of transverse colon: Secondary | ICD-10-CM

## 2013-12-31 DIAGNOSIS — D126 Benign neoplasm of colon, unspecified: Secondary | ICD-10-CM

## 2013-12-31 DIAGNOSIS — D124 Benign neoplasm of descending colon: Secondary | ICD-10-CM

## 2013-12-31 MED ORDER — SODIUM CHLORIDE 0.9 % IV SOLN: 500.0000 mL | INTRAVENOUS | Status: DC

## 2013-12-31 NOTE — Patient Instructions (Signed)

## 2013-12-31 NOTE — Progress Notes (Signed)
Patient awakening,vss,report to rn 

## 2013-12-31 NOTE — Op Note (Signed)
[place]  [address] [place], [id number]   COLONOSCOPY PROCEDURE REPORT PATIENT: [name], [name]  MR#: [id number] BIRTHDATE: April 12, 1941 , 73  yrs. old GENDER: Female ENDOSCOPIST: Ladene Artist, MD, [facility] REFERRED YT:KZSWF Bouska, M.D. PROCEDURE DATE:  12/31/2013 PROCEDURE:   Colonoscopy with biopsy and snare polypectomy First Screening Colonoscopy - Avg.  risk and is 50 yrs.  old or older - No.  Prior Negative Screening - Now for repeat screening. N/A  History of Adenoma - Now for follow-up colonoscopy & has been > or = to 3 yrs.  Yes hx of adenoma.  Has been 3 or more years since last colonoscopy.  Polyps Removed Today? Yes. ASA CLASS:   Class II INDICATIONS:Patient's personal history of adenomatous colon polyps.  MEDICATIONS: MAC sedation, administered by CRNA and propofol (Diprivan) 200mg  IV DESCRIPTION OF PROCEDURE:   After the risks benefits and alternatives of the procedure were thoroughly explained, informed consent was obtained.  A digital rectal exam revealed no abnormalities of the rectum.   The LB [id number]  endoscope was introduced through the anus and advanced to the cecum, which was identified by both the appendix and ileocecal valve. No adverse events experienced.   The quality of the prep was adequate, using MoviPrep  The instrument was then slowly withdrawn as the colon was fully examined.  COLON FINDINGS: Two measuring 6 sessile-10 mm pedunculated in size were found in the ascending colon.  A polypectomy was performed with a cold snare and using snare cautery, respectively. The resection was complete and the polyp tissue was completely retrieved.  Four sessile polyps measuring 4, 6, 8 mm in size were found in the transverse colon.  A polypectomy was performed with cold forceps, with a cold snare and using snare cautery, respectively.  The resection was complete and the polyp tissue was completely retrieved.   Three polyps  measuring 7, 10, 10 mm in size were found in the descending colon. The smaller was sessile and the two larger were pedunculated.  A polypectomy was performed with a cold snare for the 7 mm  polyp and using snare cautery for the two 10 mm  poylps.  The resection was complete and the polyp tissue was completely retrieved.   A semi-pedunculated polyp measuring 6 mm in size was found in the sigmoid colon.  A polypectomy was performed with a cold snare.  The resection was complete and the polyp tissue was completely retrieved.   Moderate diverticulosis was noted in the sigmoid colon.   The colon was otherwise normal.  There was no diverticulosis, inflammation, polyps or cancers unless previously stated.  Retroflexion was not performed due to a narrow rectal vault. The time to cecum=1 minutes 51 seconds.  Withdrawal time=16 minutes 0 seconds.  The scope was withdrawn and the procedure completed.  COMPLICATIONS: There were no complications.  ENDOSCOPIC IMPRESSION: 1.   Two polyps measuring 6-10 mm in the ascending colon; polypectomy performed with cold snare and using snare cautery 2.   Four sessile polyps measuring 4, 6, 8 mm in the transverse colon; polypectomy performed with cold forceps, with a cold snare and using snare cautery 3.  Three polyps measuring 7-10 mm in the descending colon; polypectomy performed with cold snare and using snare cautery 4.   Semi-pedunculated polyp measuring 6 mm in the sigmoid colon; polypectomy was performed with a cold snare 5.   Moderate diverticulosis in the sigmoid colon  RECOMMENDATIONS: 1.  Await pathology results 2.  Hold aspirin, aspirin products, and anti-inflammatory medication for 2 weeks. 3.  Repeat Colonoscopy in 1-2 years pending pathology review   eSigned:  Ladene Artist, MD, [facility] 12/31/2013 9:32 AM      PATIENT NAME:  [name], [name] MR#: [id number]

## 2013-12-31 NOTE — Progress Notes (Signed)
Called to room to assist during endoscopic procedure.  Patient ID and intended procedure confirmed with present staff. Received instructions for my participation in the procedure from the performing physician.  

## 2014-01-01 ENCOUNTER — Telehealth: Payer: Self-pay | Admitting: *Deleted

## 2014-01-01 NOTE — Telephone Encounter (Signed)
  Follow up Call-  Call back number 12/31/2013  Post procedure Call Back phone  # [phone]  Permission to leave phone message Yes     Patient questions:  Do you have a fever, pain , or abdominal swelling? No. Pain Score  0 *  Have you tolerated food without any problems? Yes.    Have you been able to return to your normal activities? Yes.    Do you have any questions about your discharge instructions: Diet   No. Medications  No. Follow up visit  No.  Do you have questions or concerns about your Care? No.  Actions: * If pain score is 4 or above: No action needed, pain <4.

## 2014-01-06 ENCOUNTER — Encounter: Payer: Self-pay | Admitting: Gastroenterology

## 2014-01-13 NOTE — Telephone Encounter (Signed)
Colon done 12-31-13/yf

## 2014-03-25 ENCOUNTER — Encounter: Payer: Self-pay | Admitting: Pulmonary Disease

## 2014-03-25 ENCOUNTER — Ambulatory Visit (INDEPENDENT_AMBULATORY_CARE_PROVIDER_SITE_OTHER): Payer: Medicare Other | Admitting: Pulmonary Disease

## 2014-03-25 VITALS — BP 118/68 | HR 87 | Temp 97.0°F | Ht 62.0 in | Wt 127.8 lb

## 2014-03-25 DIAGNOSIS — J438 Other emphysema: Secondary | ICD-10-CM

## 2014-03-25 NOTE — Assessment & Plan Note (Signed)
The patient appears to be doing very well from a COPD standpoint. She continues on her bronchodilator regimen, and has not had a recent acute exacerbation. I have asked her to continue on her current medication, and to stay as active as possible. She is to let us know if she sees a change over time with exertion.

## 2014-03-25 NOTE — Progress Notes (Signed)
   Subjective:    Patient ID: [name], female    DOB: Dec 18, 1940, 73 y.o.   MRN: [id number]  HPI The patient comes in today for follow-up of her known COPD. She is staying on her bronchodilator regimen, and has not had a recent flareup or pulmonary infection. She feels that her exertional tolerance is at her usual baseline, and denies any cough or congestion.   Review of Systems  Constitutional: Negative for fever and unexpected weight change.  HENT: Negative for congestion, dental problem, ear pain, nosebleeds, postnasal drip, rhinorrhea, sinus pressure, sneezing, sore throat and trouble swallowing.   Eyes: Negative for redness and itching.  Respiratory: Positive for cough and choking. Negative for chest tightness, shortness of breath and wheezing.   Cardiovascular: Negative for palpitations and leg swelling.  Gastrointestinal: Negative for nausea and vomiting.  Genitourinary: Negative for dysuria.  Musculoskeletal: Negative for joint swelling.  Skin: Negative for rash.  Neurological: Negative for headaches.  Hematological: Does not bruise/bleed easily.  Psychiatric/Behavioral: Negative for dysphoric mood. The patient is not nervous/anxious.        Objective:   Physical Exam Well-developed female in no acute distress Nose without purulence or discharge noted Neck without lymphadenopathy or thyromegaly Chest with mildly decreased breath sounds, no active wheezing Cardiac exam with regular rate and rhythm Lower extremities without edema, no cyanosis Alert and oriented, moves all 4 extremities.       Assessment & Plan:

## 2014-03-25 NOTE — Patient Instructions (Signed)
No change in medications. Try and stay as active as possible.  If you feel your tolerance for exertion is decreasing, we can check your oxygen level with exertion.   followup with me again in 62mos

## 2014-05-14 ENCOUNTER — Other Ambulatory Visit: Payer: Self-pay | Admitting: Pulmonary Disease

## 2014-06-03 ENCOUNTER — Telehealth: Payer: Self-pay | Admitting: Pulmonary Disease

## 2014-06-03 MED ORDER — FORMOTEROL FUMARATE 12 MCG IN CAPS: ORAL_CAPSULE | RESPIRATORY_TRACT | Status: DC

## 2014-06-03 NOTE — Telephone Encounter (Signed)
Rx has been sent in. Pt is aware. 

## 2014-09-23 ENCOUNTER — Encounter: Payer: Self-pay | Admitting: Pulmonary Disease

## 2014-09-23 ENCOUNTER — Ambulatory Visit: Payer: Medicare Other | Admitting: Pulmonary Disease

## 2014-09-23 ENCOUNTER — Ambulatory Visit (INDEPENDENT_AMBULATORY_CARE_PROVIDER_SITE_OTHER): Payer: Medicare Other | Admitting: Pulmonary Disease

## 2014-09-23 VITALS — BP 110/64 | HR 71 | Temp 97.0°F | Ht 62.0 in | Wt 128.8 lb

## 2014-09-23 DIAGNOSIS — J438 Other emphysema: Secondary | ICD-10-CM

## 2014-09-23 NOTE — Assessment & Plan Note (Signed)
The patient is doing fairly well from a pulmonary standpoint, with no recent acute exacerbation or worsening of symptoms. I have asked her to continue on her current bronchodilator regimen, and to try and stay as active as possible.

## 2014-09-23 NOTE — Progress Notes (Signed)
   Subjective:    Patient ID: [name], female    DOB: 08-16-1940, 74 y.o.   MRN: [id number]  HPI The patient comes in today for follow-up of her known COPD. She is staying on her bronchodilator regimen, and has not had a recent acute exacerbation. He feels that her breathing is near her normal level, and denies any significant chest congestion or cough.   Review of Systems  Constitutional: Negative for fever and unexpected weight change.  HENT: Negative for congestion, dental problem, ear pain, nosebleeds, postnasal drip, rhinorrhea, sinus pressure, sneezing, sore throat and trouble swallowing.   Eyes: Negative for redness and itching.  Respiratory: Positive for cough. Negative for chest tightness, shortness of breath and wheezing.   Cardiovascular: Negative for palpitations and leg swelling.  Gastrointestinal: Negative for nausea and vomiting.  Genitourinary: Negative for dysuria.  Musculoskeletal: Negative for joint swelling.  Skin: Negative for rash.  Neurological: Negative for headaches.  Hematological: Does not bruise/bleed easily.  Psychiatric/Behavioral: Negative for dysphoric mood. The patient is not nervous/anxious.        Objective:   Physical Exam Thin female in no acute distress Nose without purulence or discharge noted Neck without lymphadenopathy or thyromegaly Chest with decreased breath sounds, no active wheezing Cardiac exam with regular rate and rhythm Lower extremities without edema, no cyanosis Alert and oriented, moves all 4 extremities.       Assessment & Plan:

## 2014-09-23 NOTE — Patient Instructions (Signed)
No change in medications Continue to stay as active as possible. followup with Dr. Lamonte Sakai in 53mos.

## 2014-10-05 ENCOUNTER — Telehealth: Payer: Self-pay | Admitting: Pulmonary Disease

## 2014-10-05 NOTE — Telephone Encounter (Signed)
Spoke with pt, states that her pharmacy has quit carrying foradil.  Pt is requesting an alternative.   Pt has used symbicort in the past, was not able to tolerate this well.  [facility] [address]     Sending to [place] as he was designated to assume pt's care after [facility] leave.  Dr. B please advise.  Thanks!

## 2014-10-06 NOTE — Telephone Encounter (Signed)
Pt calling back about med, she is completely out please advise.[name]

## 2014-10-06 NOTE — Telephone Encounter (Signed)
Pt is aware we are awaiting on the doc response. Please advise thanks

## 2014-10-07 ENCOUNTER — Ambulatory Visit: Payer: Medicare Other | Admitting: Pulmonary Disease

## 2014-10-07 NOTE — Telephone Encounter (Signed)
Spoke with pt, states that foradil is a manufacturers discontinue and needs an alternative asap.  Pt has been out of this med X3 days and can tell a significant decline in her breathing since stopping foradil.    RB please advise of alternative.  Thanks!

## 2014-10-07 NOTE — Telephone Encounter (Signed)
atc pt-line kept ringing then went to fast busy signal.   Need to know what [facility] sent to.

## 2014-10-07 NOTE — Telephone Encounter (Signed)
Pt called back & states she has called [facility] & they can order the Foradil & she will be able to pick up tomorrow. Please send to [facility] on [address]. Pt's # [phone]

## 2014-10-07 NOTE — Telephone Encounter (Signed)
Pt calling stating that [facility] not [facility] the [facility] and would like to switch to something else, ignore msg about her being able to get tomorrow, please call pt and let her know which med she is being swithed to.[name]

## 2014-10-07 NOTE — Telephone Encounter (Signed)
[facility]  Patient would like something called in that works the same as Foradil.  Patient is completely out of her medication. RB - please advise.

## 2014-10-08 MED ORDER — FLUTICASONE FUROATE 100 MCG/ACT IN AEPB: 1.0000 | INHALATION_SPRAY | Freq: Every day | RESPIRATORY_TRACT | Status: DC

## 2014-10-08 NOTE — Telephone Encounter (Signed)
Called made pt aware of recs. rx sent in

## 2014-10-08 NOTE — Telephone Encounter (Signed)
She can start Arnuity, 1 inhalation once a day.

## 2014-10-21 ENCOUNTER — Telehealth: Payer: Self-pay | Admitting: Emergency Medicine

## 2014-10-21 ENCOUNTER — Encounter: Payer: Self-pay | Admitting: Emergency Medicine

## 2014-10-21 MED ORDER — TIOTROPIUM BROMIDE MONOHYDRATE 18 MCG IN CAPS: 18.0000 ug | ORAL_CAPSULE | Freq: Every day | RESPIRATORY_TRACT | Status: DC

## 2014-10-21 NOTE — Telephone Encounter (Signed)
Pt is aware of VS's response. She will call us if she starts to have any issues with Arnuity. Nothing further was needed at this time.

## 2014-10-21 NOTE — Telephone Encounter (Signed)
Please explain to her that arnuity is an inhaled steroid medication.  In review of her records she has been using an inhaled steroid medication for several years.  If she has not had any difficulty tolerating inhaled steroids before, then she can continue using arnuity.

## 2014-10-21 NOTE — Telephone Encounter (Signed)
Called and spoke to pt. Pt requesting a refill on spiriva. Rx sent to preferred pharmacy. Pt also stated she has an autoimmune disease - Lichen Sclerosus. The information insert for Arnuity stated not to take the medication if you have an autoimmune disease. Pt questioning if she should still take medication. Pt stated she is not having an issues with the Arnuity. Will send message to doc of the day as RB will not be available till 7/11.   Dr. Halford Chessman please advise if ok for pt to continue taking Arnuity. Thanks.

## 2014-11-11 ENCOUNTER — Telehealth: Payer: Self-pay | Admitting: Emergency Medicine

## 2014-11-11 MED ORDER — FLUCONAZOLE 100 MG PO TABS: 100.0000 mg | ORAL_TABLET | Freq: Every day | ORAL | Status: DC

## 2014-11-11 NOTE — Telephone Encounter (Signed)
Have her stop Arnuity. Take fluticasone 100mg  qd x 2 days  We can work on her jury letter 7/28

## 2014-11-11 NOTE — Telephone Encounter (Signed)
Pt is aware of RB's recommendation. Rx has been sent in. Will leave message open to follow up on jury letter.

## 2014-11-11 NOTE — Telephone Encounter (Signed)
Patient c/o thrush symptoms - feels this is from the new inhaler Arnuity. Pt states that she has had this in the past with Symbicort.  Pt states that she noticed this morning she has two white patches on the inside of lower lip close to gums.  Pt states that she has been rinsing mouth out after every use of the Arnuity inhaler.  Pt has been on this new inhaler for a little over 1 month.   Patient wanting to know if she can be excused from [employer].  Received a letter from [employer] court to appear for jury duty.  Needs to return excuse request back in within 5 days. Patient to bring jury duty letter by and drop it off in the morning 11/12/14   Please advise on both Dr Lamonte Sakai. Thanks.

## 2014-11-12 ENCOUNTER — Encounter: Payer: Self-pay | Admitting: *Deleted

## 2014-11-12 NOTE — Telephone Encounter (Signed)
Letter has been written and signed by RB. This has been given to pt's daughter. Nothing further was needed.

## 2014-12-07 ENCOUNTER — Ambulatory Visit (INDEPENDENT_AMBULATORY_CARE_PROVIDER_SITE_OTHER): Payer: Medicare Other | Admitting: Adult Health

## 2014-12-07 ENCOUNTER — Ambulatory Visit (INDEPENDENT_AMBULATORY_CARE_PROVIDER_SITE_OTHER)
Admission: RE | Admit: 2014-12-07 | Discharge: 2014-12-07 | Disposition: A | Discharge status: Home / Self Care | Payer: Medicare Other | Source: Ambulatory Visit | Attending: Adult Health | Admitting: Adult Health

## 2014-12-07 ENCOUNTER — Telehealth: Payer: Self-pay | Admitting: Adult Health

## 2014-12-07 ENCOUNTER — Encounter: Payer: Self-pay | Admitting: Adult Health

## 2014-12-07 VITALS — BP 100/70 | HR 69 | Temp 97.6°F | Ht 63.0 in | Wt 129.0 lb

## 2014-12-07 DIAGNOSIS — Z23 Encounter for immunization: Secondary | ICD-10-CM | POA: Diagnosis not present

## 2014-12-07 DIAGNOSIS — J439 Emphysema, unspecified: Secondary | ICD-10-CM | POA: Diagnosis not present

## 2014-12-07 DIAGNOSIS — J449 Chronic obstructive pulmonary disease, unspecified: Secondary | ICD-10-CM

## 2014-12-07 MED ORDER — OLODATEROL HCL 2.5 MCG/ACT IN AERS: 2.0000 | INHALATION_SPRAY | Freq: Every day | RESPIRATORY_TRACT | Status: DC | PRN

## 2014-12-07 NOTE — Progress Notes (Signed)
Quick Note:  Called and spoke with pt. Reviewed results and recs. Pt voiced understanding and stated she would be by this week to bring TP her formulary. Pt had no further questions. ______

## 2014-12-07 NOTE — Progress Notes (Signed)
Quick Note:  Called and spoke with pt. Reviewed results and recs. Pt voiced understanding and had no further questions. ______ 

## 2014-12-07 NOTE — Patient Instructions (Signed)
Prevnar Vaccine today .  Chest xray today  Begin Striverdi 2 puffs daily .  follow up Dr. Lamonte Sakai  In 2-3 months and As needed

## 2014-12-07 NOTE — Telephone Encounter (Signed)
Spoke with pt. States that TP sent in [place] for her and it's not on her formulary. States it's going to cost to much money. Pt has a copy of her formulary, she is going to bring it by here for TP to look at. Will route message to [facility] to follow up on.

## 2014-12-07 NOTE — Telephone Encounter (Addendum)
Notified TP of Striverdi issue and she is aware pt is bringing by formulary for her to view.  Called to review results and recs of CXR done at today's visit with pt. She stated she would be by the office this week to drop off her formulary

## 2014-12-07 NOTE — Progress Notes (Signed)
   Subjective:    Patient ID: [name], female    DOB: 09/07/1940, 74 y.o.   MRN: [id number]  HPI 74 year old female with known severe COPD , former smoker  12/07/2014 Follow up : Severe COPD  She is a Former [place] . pt here to discuss another option then arnuity and foradil. Pt states foradil was discontinued x2 months. Patient says that her pharmacy says this has without of production. We did call to other pharmacies which confirmed this. Since stopping Foradil. She complains of increased SOB with activity and dry cough. Denies any chest congestion/tightness, wheezing, PND, sinus congestion.  We discussed starting a no other long-acting broncho-dilator. She denies any hemoptysis, chest pain, orthopnea, PND or leg swelling Pneumovax is up-to-date Last chest x-ray June 2013 with COPD changes Review of Systems Constitutional:   No  weight loss, night sweats,  Fevers, chills, +fatigue, or  lassitude.  HEENT:   No headaches,  Difficulty swallowing,  Tooth/dental problems, or  Sore throat,                No sneezing, itching, ear ache, nasal congestion, post nasal drip,   CV:  No chest pain,  Orthopnea, PND, swelling in lower extremities, anasarca, dizziness, palpitations, syncope.   GI  No heartburn, indigestion, abdominal pain, nausea, vomiting, diarrhea, change in bowel habits, loss of appetite, bloody stools.   Resp:  No chest wall deformity  Skin: no rash or lesions.  GU: no dysuria, change in color of urine, no urgency or frequency.  No flank pain, no hematuria   MS:  No joint pain or swelling.  No decreased range of motion.  No back pain.  Psych:  No change in mood or affect. No depression or anxiety.  No memory loss.         Objective:   Physical Exam GEN: A/Ox3; pleasant , NAD, thin and frail  Vital signs reviewed  HEENT:  [place]/AT,  EACs-clear, TMs-wnl, NOSE-clear, THROAT-clear, no lesions, no postnasal drip or exudate noted.   NECK:  Supple w/ fair ROM; no JVD;  normal carotid impulses w/o bruits; no thyromegaly or nodules palpated; no lymphadenopathy.  RESP  decreased breath sounds in the bases  w/o, wheezes/ rales/ or rhonchi.no accessory muscle use, no dullness to percussion  CARD:  RRR, no m/r/g  , no peripheral edema, pulses intact, no cyanosis or clubbing.  GI:   Soft & nt; nml bowel sounds; no organomegaly or masses detected.  Musco: Warm bil, no deformities or joint swelling noted.   Neuro: alert, no focal deficits noted.    Skin: Warm, no lesions or rashes         Assessment & Plan:

## 2014-12-08 ENCOUNTER — Telehealth: Payer: Self-pay | Admitting: Adult Health

## 2014-12-08 MED ORDER — SALMETEROL XINAFOATE 50 MCG/DOSE IN AEPB: 1.0000 | INHALATION_SPRAY | Freq: Two times a day (BID) | RESPIRATORY_TRACT | Status: DC

## 2014-12-08 NOTE — Telephone Encounter (Signed)
Patient's daughter brought in formulary for TP to review, placed in TP's folder.

## 2014-12-08 NOTE — Telephone Encounter (Signed)
Serevent is on formulary  Serevent #1 1 puff Twice daily  , rinse after use.  follow up as planned

## 2014-12-08 NOTE — Assessment & Plan Note (Signed)
Severe COPD worse control off Foradil  We'll change to a no other long acting bronchial dilator Intolerant to inhaled steroids  Plan  Prevnar Vaccine today .  Chest xray today  Begin Striverdi 2 puffs daily .  follow up Dr. Lamonte Sakai  In 2-3 months and As needed

## 2014-12-08 NOTE — Telephone Encounter (Signed)
Brooke Needles, NP at 12/08/2014 5:13 PM     Status: Signed       Expand All Collapse All   Serevent is on formulary  Serevent #1 1 puff Twice daily , rinse after use.  follow up as planned        Called and spoke to pt Reviewed results and recs. Pt voiced understanding and had no further questions. [facility] [address] [facility] [place] Nothing further needed

## 2014-12-09 NOTE — Telephone Encounter (Signed)
Patient received medication and started it last night Patient notified to take 1 puff twice daily Notified to rinse after use Nothing further needed.

## 2015-02-09 ENCOUNTER — Encounter: Payer: Self-pay | Admitting: Emergency Medicine

## 2015-02-09 ENCOUNTER — Ambulatory Visit (INDEPENDENT_AMBULATORY_CARE_PROVIDER_SITE_OTHER): Payer: Medicare Other | Admitting: Emergency Medicine

## 2015-02-09 VITALS — BP 102/60 | HR 78 | Ht 62.0 in | Wt 129.0 lb

## 2015-02-09 DIAGNOSIS — J439 Emphysema, unspecified: Secondary | ICD-10-CM

## 2015-02-09 NOTE — Progress Notes (Signed)
Subjective:    Patient ID: [name], female    DOB: 06-29-1940, 74 y.o.   MRN: [id number]  HPI 74 yo woman, former smoker (40 pack years), history of hyperthyroidism and chronic pulmonary emboli, and followed by Dr Gwenette Greet for COPD. She was last seen in our office by T Parrett at which time her meds were adjusted to Serevent + Spiriva. Since making the change she has been doing better. She feels that she may have lost some ground initially but has rebounded. She rarely uses albuterol. Minimal cough and mucous. No CP or tightness. No flares reported. She does have some SOB with stairs.    Review of Systems As per HPI  Past Medical History  Diagnosis Date  . Benign neoplasm of kidney   . Hyperlipidemia   . Hypothyroid   . Chronic pulmonary embolism ([place])   . COPD with emphysema ([place])   . Bladder tumor   . History of falling   . Blood transfusion   . Complication of anesthesia 05-07-2010 SURGERY---  POST OP ACUTE HYPOXIA WHEN TAKEN OFF OXYGEN--   SECONDARY COPD STAGE III/ CHRONIC PE  . Hematuria   . Blood transfusion without reported diagnosis   . Cataract     developing per last eye exam  . Lichen sclerosus      Family History  Problem Relation Age of Onset  . Heart disease Mother   . Stroke Mother   . Heart disease Father   . Colon cancer Neg Hx   . Esophageal cancer Neg Hx   . Rectal cancer Neg Hx   . Stomach cancer Neg Hx      Social History   Social History  . Marital Status: Divorced    Spouse Name: N/A  . Number of Children: Y  . Years of Education: N/A   Occupational History  . 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 Smoking status: Former Smoker -- 1.00 packs/day for 40 years    Types: Cigarettes    Quit date: 04/17/1998  . Smokeless tobacco: Never Used  . Alcohol Use: No  . Drug Use: No  . Sexual Activity: Not on file   Other Topics Concern  . Not on file   Social History Narrative     Allergies    Allergen Reactions  . Contrast Media [Iodinated Diagnostic Agents] Diarrhea    Oral contrast   . Penicillins Hives     Outpatient Prescriptions Prior to Visit  Medication Sig Dispense Refill  . aspirin 81 MG tablet Take 81 mg by mouth daily.     . Calcium Carb-Cholecalciferol (CALCIUM 600/VITAMIN D3 PO) Take 2 tablets by mouth daily.    . carvedilol (COREG) 12.5 MG tablet Take 12.5 mg by mouth 2 (two) times daily with a meal.     . cetirizine (ZYRTEC) 10 MG tablet Take 10 mg by mouth 2 (two) times daily.     . clobetasol ointment (TEMOVATE) 0.05 % 2 (two) times daily as needed.    . desonide (DESOWEN) 0.05 % cream daily.    Marland Kitchen NIFEdipine (PROCARDIA-XL/ADALAT CC) 60 MG 24 hr tablet Take 60 mg by mouth every morning.     . pravastatin (PRAVACHOL) 40 MG tablet Take 40 mg by mouth every morning.     . salmeterol (SEREVENT DISKUS) 50 MCG/DOSE diskus inhaler Inhale 1 puff into the lungs 2 (two) times daily. 1 Inhaler 5  . tacrolimus (PROTOPIC) 0.1 % ointment  Apply topically daily.    Marland Kitchen tiotropium (SPIRIVA HANDIHALER) 18 MCG inhalation capsule Place 1 capsule (18 mcg total) into inhaler and inhale daily. 30 capsule 5  . vitamin C (ASCORBIC ACID) 500 MG tablet Take 1,000 mg by mouth as needed.     . clotrimazole (LOTRIMIN) 1 % cream Apply 1 application topically daily.    Marland Kitchen levothyroxine (SYNTHROID, LEVOTHROID) 75 MCG tablet 50 mcg daily before breakfast. Alternate 3mcg with 38mcg every 30 days.    . Olodaterol HCl (STRIVERDI RESPIMAT) 2.5 MCG/ACT AERS Inhale 2 puffs into the lungs daily as needed. 1 Inhaler 5  . sennosides-docusate sodium (SENOKOT-S) 8.6-50 MG tablet Take 1 tablet by mouth as needed.     . fluconazole (DIFLUCAN) 100 MG tablet Take 1 tablet (100 mg total) by mouth daily. 2 tablet 0  . Fluticasone Furoate (ARNUITY ELLIPTA) 100 MCG/ACT AEPB Inhale 1 puff into the lungs daily. 30 each 2  . formoterol (FORADIL AEROLIZER) 12 MCG capsule for inhaler PLACE 1 CAPSULE (12 MCG TOTAL) INTO  INHALER AND INHALE 2 (TWO) TIMES DAILY. 60 capsule 6   No facility-administered medications prior to visit.         Objective:   Physical Exam  Filed Vitals:   02/09/15 1413 02/09/15 1414  BP:  102/60  Pulse:  78  Height: 5\' 2"  (1.575 m)   Weight: 129 lb (58.514 kg)   SpO2:  96%   Gen: Pleasant, well-nourished, in no distress,  normal affect  ENT: No lesions,  mouth clear,  oropharynx clear, no postnasal drip  Neck: No JVD, no TMG, no carotid bruits  Lungs: No use of accessory muscles, clear without rales or rhonchi  Cardiovascular: RRR, heart sounds normal, no murmur or gallops, no peripheral edema  Musculoskeletal: No deformities, no cyanosis or clubbing  Neuro: alert, non focal  Skin: Warm, no lesions or rashes     Assessment & Plan:  COPD (chronic obstructive pulmonary disease) with emphysema She has tolerated the medicatiokn change since stopping foradil. We will plan to continue current regimen.   Please continue your Serevent and Spiriva as you have been taking them.  Keep albuterol available to use if needed for shortness of breath.  No flu shot given allergic reaction in the past Follow with Dr Lamonte Sakai in 6 months or sooner if you have any problems  Other pulmonary embolism and infarction She states that she was never anticoagulated based on the small and chronic nature of her pulmonary emboli. Decision was made to follow off therapy. No filter was placed.Marland Kitchen

## 2015-02-09 NOTE — Patient Instructions (Addendum)
Please continue your Serevent and Spiriva as you have been taking them.  Keep albuterol available to use if needed for shortness of breath.  No flu shot given allergic reaction in the past Follow with Dr Lamonte Sakai in 6 months or sooner if you have any problems

## 2015-02-09 NOTE — Assessment & Plan Note (Signed)
She states that she was never anticoagulated based on the small and chronic nature of her pulmonary emboli. Decision was made to follow off therapy. No filter was placed.Marland Kitchen

## 2015-02-09 NOTE — Assessment & Plan Note (Signed)
She has tolerated the medicatiokn change since stopping foradil. We will plan to continue current regimen.   Please continue your Serevent and Spiriva as you have been taking them.  Keep albuterol available to use if needed for shortness of breath.  No flu shot given allergic reaction in the past Follow with Dr Lamonte Sakai in 6 months or sooner if you have any problems

## 2015-02-09 NOTE — Progress Notes (Signed)
   Subjective:    Patient ID: [name], female    DOB: [birth year], 74 y.o.   MRN: [id number]  HPI 74 year old female with known severe COPD , former smoker  02/09/2015 Follow up : Severe COPD  She is a Former [place] . pt here to discuss another option then arnuity and foradil. Pt states foradil was discontinued x2 months. Patient says that her pharmacy says this has without of production. We did call to other pharmacies which confirmed this. Since stopping Foradil. She complains of increased SOB with activity and dry cough. Denies any chest congestion/tightness, wheezing, PND, sinus congestion.  We discussed starting a no other long-acting broncho-dilator. She denies any hemoptysis, chest pain, orthopnea, PND or leg swelling Pneumovax is up-to-date Last chest x-ray June 2013 with COPD changes Review of Systems Constitutional:   No  weight loss, night sweats,  Fevers, chills, +fatigue, or  lassitude.  HEENT:   No headaches,  Difficulty swallowing,  Tooth/dental problems, or  Sore throat,                No sneezing, itching, ear ache, nasal congestion, post nasal drip,   CV:  No chest pain,  Orthopnea, PND, swelling in lower extremities, anasarca, dizziness, palpitations, syncope.   GI  No heartburn, indigestion, abdominal pain, nausea, vomiting, diarrhea, change in bowel habits, loss of appetite, bloody stools.   Resp:  No chest wall deformity  Skin: no rash or lesions.  GU: no dysuria, change in color of urine, no urgency or frequency.  No flank pain, no hematuria   MS:  No joint pain or swelling.  No decreased range of motion.  No back pain.  Psych:  No change in mood or affect. No depression or anxiety.  No memory loss.         Objective:   Physical Exam GEN: A/Ox3; pleasant , NAD, thin and frail  Vital signs reviewed  HEENT:  [place]/AT,  EACs-clear, TMs-wnl, NOSE-clear, THROAT-clear, no lesions, no postnasal drip or exudate noted.   NECK:  Supple w/ fair ROM; no JVD;  normal carotid impulses w/o bruits; no thyromegaly or nodules palpated; no lymphadenopathy.  RESP  decreased breath sounds in the bases  w/o, wheezes/ rales/ or rhonchi.no accessory muscle use, no dullness to percussion  CARD:  RRR, no m/r/g  , no peripheral edema, pulses intact, no cyanosis or clubbing.  GI:   Soft & nt; nml bowel sounds; no organomegaly or masses detected.  Musco: Warm bil, no deformities or joint swelling noted.   Neuro: alert, no focal deficits noted.    Skin: Warm, no lesions or rashes         Assessment & Plan:

## 2015-04-11 ENCOUNTER — Other Ambulatory Visit: Payer: Self-pay | Admitting: Emergency Medicine

## 2015-06-01 ENCOUNTER — Other Ambulatory Visit: Payer: Self-pay | Admitting: Adult Health

## 2015-06-09 ENCOUNTER — Telehealth: Payer: Self-pay | Admitting: Emergency Medicine

## 2015-06-09 NOTE — Telephone Encounter (Signed)
Looked on cover my meds and did not provide alternatives. [employer] and spoke with Debbie. Pt must try and fail serevent Diskus. Please advise Dr. Lamonte Sakai thanks

## 2015-06-09 NOTE — Telephone Encounter (Signed)
Striverdi has been denied.  bcbs- [phone]

## 2015-06-09 NOTE — Telephone Encounter (Signed)
PA initiated through [facility] for Striverdi [place]: [id number]  Your information has been submitted to [employer] [place]. [place] will review the request and notify you of the determination decision directly, typically within 3 business days of your submission and once all necessary information is received. You will also receive your request decision electronically. To check for an update later, open the request again from your dashboard. If [place] [facility] has not responded within the specified timeframe or if you have any questions about your PA submission, contact [facility] directly at [facility]) [phone] or ([place]) [phone].

## 2015-06-14 NOTE — Telephone Encounter (Signed)
Spoke with pt. This medication has already been changed. Nothing further was needed.

## 2015-06-14 NOTE — Telephone Encounter (Signed)
OK to change to serevent discus bid

## 2015-08-03 ENCOUNTER — Encounter: Payer: Self-pay | Admitting: Emergency Medicine

## 2015-08-03 ENCOUNTER — Ambulatory Visit (INDEPENDENT_AMBULATORY_CARE_PROVIDER_SITE_OTHER): Payer: Medicare Other | Admitting: Emergency Medicine

## 2015-08-03 VITALS — BP 100/70 | HR 72 | Ht 62.0 in | Wt 130.0 lb

## 2015-08-03 DIAGNOSIS — R600 Localized edema: Secondary | ICD-10-CM

## 2015-08-03 DIAGNOSIS — J439 Emphysema, unspecified: Secondary | ICD-10-CM | POA: Diagnosis not present

## 2015-08-03 MED ORDER — TIOTROPIUM BROMIDE MONOHYDRATE 18 MCG IN CAPS: ORAL_CAPSULE | RESPIRATORY_TRACT | Status: DC

## 2015-08-03 MED ORDER — FORMOTEROL FUMARATE 12 MCG IN CAPS: 12.0000 ug | ORAL_CAPSULE | Freq: Two times a day (BID) | RESPIRATORY_TRACT | Status: DC

## 2015-08-03 NOTE — Assessment & Plan Note (Signed)
We will try stopping serevent and going back to foradil twice a day. She wants to try to get this from [place]  Continue spiriva daily Take albuterol 2 puffs up to every 4 hours if needed for shortness of breath. We will write a prescription for this.  Follow with Dr Lamonte Sakai in 3 months or sooner if you have any problems.

## 2015-08-03 NOTE — Addendum Note (Signed)
Addended by: Desmond Dike C on: 08/03/2015 04:01 PM   Modules accepted: Orders, SmartSet

## 2015-08-03 NOTE — Assessment & Plan Note (Signed)
Subtle lower extreme pedal edema. She states this is new. Not associated with leg pain. There are no cords on examination or tenderness to palpation. Because she has history of pulmonary emboli in the past I feel that we must check bilateral lower showed a Doppler ultrasound. If this is unrevealing and if she continues to have symptoms we can consider an echocardiogram at some point in the future.

## 2015-08-03 NOTE — Addendum Note (Signed)
Addended by: Desmond Dike C on: 08/03/2015 04:10 PM   Modules accepted: Orders, SmartSet

## 2015-08-03 NOTE — Progress Notes (Signed)
Subjective:    Patient ID: [name], female    DOB: 08-17-1940, 75 y.o.   MRN: [id number]  HPI 75 yo woman, former smoker (40 pack years), history of hyperthyroidism and chronic pulmonary emboli, and followed by Dr Gwenette Greet for COPD. She was last seen in our office by T Parrett at which time her meds were adjusted to Serevent + Spiriva. Since making the change she has been doing better. She feels that she may have lost some ground initially but has rebounded. She rarely uses albuterol. Minimal cough and mucous. No CP or tightness. No flares reported. She does have some SOB with stairs.   ROV 08/03/15 -- follow-up visit for patient with COPD and a history of chronic pulmonary emboli. She is currently managed on Spiriva and Serevent, because foradil stopped manufacturing in the [place].  She has been more dyspneic since our last visit. Some ankle swelling that she has noted over the last month. Bilateral but more on the L. No calf pain.    Review of Systems As per HPI  Past Medical History  Diagnosis Date  . Benign neoplasm of kidney   . Hyperlipidemia   . Hypothyroid   . Chronic pulmonary embolism ([place])   . COPD with emphysema ([place])   . Bladder tumor   . History of falling   . Blood transfusion   . Complication of anesthesia 05-07-2010 SURGERY---  POST OP ACUTE HYPOXIA WHEN TAKEN OFF OXYGEN--   SECONDARY COPD STAGE III/ CHRONIC PE  . Hematuria   . Blood transfusion without reported diagnosis   . Cataract     developing per last eye exam  . Lichen sclerosus      Family History  Problem Relation Age of Onset  . Heart disease Mother   . Stroke Mother   . Heart disease Father   . Colon cancer Neg Hx   . Esophageal cancer Neg Hx   . Rectal cancer Neg Hx   . Stomach cancer Neg Hx      Social History   Social History  . Marital Status: Divorced    Spouse Name: N/A  . Number of Children: Y  . Years of Education: N/A   Occupational History  . retired housewife and  substitute Education officer, [employer]    Social History Main Topics  . Smoking status: Former Smoker -- 1.00 packs/day for 40 years    Types: Cigarettes    Quit date: 04/17/1998  . Smokeless tobacco: Never Used  . Alcohol Use: No  . Drug Use: No  . Sexual Activity: Not on file   Other Topics Concern  . Not on file   Social History Narrative     Allergies  Allergen Reactions  . Contrast Media [Iodinated Diagnostic Agents] Diarrhea    Oral contrast   . Penicillins Hives     Outpatient Prescriptions Prior to Visit  Medication Sig Dispense Refill  . aspirin 81 MG tablet Take 81 mg by mouth daily.     . Calcium Carb-Cholecalciferol (CALCIUM 600/VITAMIN D3 PO) Take 2 tablets by mouth daily.    . carvedilol (COREG) 12.5 MG tablet Take 12.5 mg by mouth 2 (two) times daily with a meal.     . cetirizine (ZYRTEC) 10 MG tablet Take 10 mg by mouth 2 (two) times daily.     Marland Kitchen desonide (DESOWEN) 0.05 % cream daily.    Marland Kitchen levothyroxine (SYNTHROID, LEVOTHROID) 50 MCG tablet As directed    . NIFEdipine (PROCARDIA-XL/ADALAT CC) 60 MG  24 hr tablet Take 60 mg by mouth every morning.     . pravastatin (PRAVACHOL) 40 MG tablet Take 40 mg by mouth every morning.     . SEREVENT DISKUS 50 MCG/DOSE diskus inhaler INHALE 1 PUFF TWICE DAILY 60 g 5  . SPIRIVA HANDIHALER 18 MCG inhalation capsule PLACE 1 CAPSULE INTO INHALER AND INHALE DAILY. 30 capsule 5  . tacrolimus (PROTOPIC) 0.1 % ointment Apply topically daily.    . vitamin C (ASCORBIC ACID) 500 MG tablet Take 1,000 mg by mouth as needed.     . clobetasol ointment (TEMOVATE) 0.05 % 2 (two) times daily as needed.     No facility-administered medications prior to visit.         Objective:   Physical Exam  Filed Vitals:   08/03/15 1524 08/03/15 1525  BP:  100/70  Pulse:  72  Height: 5\' 2"  (1.575 m)   Weight: 130 lb (58.968 kg)   SpO2:  91%   Gen: Pleasant, well-nourished, in no distress,  normal affect  ENT: No lesions,  mouth clear,  oropharynx  clear, no postnasal drip  Neck: No JVD, no TMG, no carotid bruits  Lungs: No use of accessory muscles, clear without rales or rhonchi  Cardiovascular: RRR, heart sounds normal, no murmur or gallops, trace bilateral ankle edema  Musculoskeletal: No deformities, no cyanosis or clubbing, no lower extremity cords palpable, no calf pain  Neuro: alert, non focal  Skin: Warm, no lesions or rashes     Assessment & Plan:  Lower extremity edema Subtle lower extreme pedal edema. She states this is new. Not associated with leg pain. There are no cords on examination or tenderness to palpation. Because she has history of pulmonary emboli in the past I feel that we must check bilateral lower showed a Doppler ultrasound. If this is unrevealing and if she continues to have symptoms we can consider an echocardiogram at some point in the future.  COPD (chronic obstructive pulmonary disease) with emphysema We will try stopping serevent and going back to foradil twice a day. She wants to try to get this from [place]  Continue spiriva daily Take albuterol 2 puffs up to every 4 hours if needed for shortness of breath. We will write a prescription for this.  Follow with Dr Lamonte Sakai in 3 months or sooner if you have any problems.

## 2015-08-03 NOTE — Patient Instructions (Signed)
We will try stopping serevent and going back to foradil twice a day Continue spiriva daily We will check bilateral doppler ultrasound of your legs to insure no evidence of blood clots.  We may want to consider performing an echocardiogram at some point in the future depending on results and how you are feeling.  Take albuterol 2 puffs up to every 4 hours if needed for shortness of breath. We will write a prescription for this.  Follow with Dr Lamonte Sakai in 3 months or sooner if you have any problems.

## 2015-08-04 ENCOUNTER — Inpatient Hospital Stay (HOSPITAL_COMMUNITY): Admission: RE | Admit: 2015-08-04 | Payer: Medicare Other | Source: Ambulatory Visit

## 2015-08-06 ENCOUNTER — Telehealth: Payer: Self-pay | Admitting: Emergency Medicine

## 2015-08-06 MED ORDER — ALBUTEROL SULFATE HFA 108 (90 BASE) MCG/ACT IN AERS: 2.0000 | INHALATION_SPRAY | Freq: Four times a day (QID) | RESPIRATORY_TRACT | Status: DC | PRN

## 2015-08-06 NOTE — Telephone Encounter (Signed)
Spoke with pt, requesting a rescue inhaler to be sent to [facility] in [place].   Pt has never had this prescribed before, states she used to get samples from [facility].   Pt denies any current breathing distress but would feel more comfortable having one on hand.  RB please advise if ok to start pt on rescue inhaler.  Thanks.

## 2015-08-06 NOTE — Telephone Encounter (Signed)
Pt is aware that RB has approved for her to have rescue inhaler-Rx sent to [facility] [place] location. Nothing more needed at this time.

## 2015-08-06 NOTE — Telephone Encounter (Signed)
Yes this is OK 

## 2015-08-10 ENCOUNTER — Ambulatory Visit (HOSPITAL_COMMUNITY)
Admission: RE | Admit: 2015-08-10 | Discharge: 2015-08-10 | Disposition: A | Discharge status: Home / Self Care | Payer: Medicare Other | Source: Ambulatory Visit | Attending: Cardiovascular Disease | Admitting: Cardiovascular Disease

## 2015-08-10 DIAGNOSIS — R600 Localized edema: Secondary | ICD-10-CM | POA: Insufficient documentation

## 2015-08-10 DIAGNOSIS — E785 Hyperlipidemia, unspecified: Secondary | ICD-10-CM | POA: Diagnosis not present

## 2015-09-29 ENCOUNTER — Telehealth: Payer: Self-pay | Admitting: Emergency Medicine

## 2015-09-29 MED ORDER — FORMOTEROL FUMARATE 12 MCG IN CAPS: 12.0000 ug | ORAL_CAPSULE | Freq: Two times a day (BID) | RESPIRATORY_TRACT | Status: DC

## 2015-09-29 NOTE — Telephone Encounter (Signed)
Spoke with the pt  She is req that we call her foradil in to [facility]  I have called rx in [phone] Pt aware and states nothing further needed

## 2015-10-20 ENCOUNTER — Encounter: Payer: Self-pay | Admitting: Gastroenterology

## 2015-10-21 ENCOUNTER — Other Ambulatory Visit: Payer: Self-pay | Admitting: Emergency Medicine

## 2015-10-21 ENCOUNTER — Telehealth: Payer: Self-pay | Admitting: Emergency Medicine

## 2015-10-21 NOTE — Telephone Encounter (Signed)
Called spoke with pt. She states she thought that her spiriva was sent to the wrong pharmacy and she wanted to clarify where the refill was sent. I explained to her that the refill was sent to [facility] on [address]. She states that this correct and she had no further questions. She voiced understanding and had no further questions. Nothing further needed.

## 2015-11-02 ENCOUNTER — Ambulatory Visit (INDEPENDENT_AMBULATORY_CARE_PROVIDER_SITE_OTHER): Payer: Medicare Other | Admitting: Emergency Medicine

## 2015-11-02 ENCOUNTER — Encounter: Payer: Self-pay | Admitting: Emergency Medicine

## 2015-11-02 VITALS — BP 110/72 | HR 70 | Ht 63.0 in | Wt 127.0 lb

## 2015-11-02 DIAGNOSIS — J439 Emphysema, unspecified: Secondary | ICD-10-CM | POA: Diagnosis not present

## 2015-11-02 NOTE — Assessment & Plan Note (Signed)
Please continue Spiriva and Foradil as you are taking them  Use albuterol 2 puffs as needed for shortness of breath.  Your pneumonia shots are up to date.  We will avoid the flu shot due to local allergic reaction.  Follow with Dr Lamonte Sakai in 6 months or sooner if you have any problems

## 2015-11-02 NOTE — Progress Notes (Signed)
Subjective:    Patient ID: [name], female    DOB: 24-Apr-1940, 75 y.o.   MRN: [id number]  HPI 75 yo woman, former smoker (40 pack years), history of hyperthyroidism and chronic pulmonary emboli, and followed by Dr Gwenette Greet for COPD. She was last seen in our office by T Parrett at which time her meds were adjusted to Serevent + Spiriva. Since making the change she has been doing better. She feels that she may have lost some ground initially but has rebounded. She rarely uses albuterol. Minimal cough and mucous. No CP or tightness. No flares reported. She does have some SOB with stairs.   ROV 08/03/15 -- follow-up visit for patient with COPD and a history of chronic pulmonary emboli. She is currently managed on Spiriva and Serevent, because foradil stopped manufacturing in the [place].  She has been more dyspneic since our last visit. Some ankle swelling that she has noted over the last month. Bilateral but more on the L. No calf pain.   ROV 11/02/15 -- follow up for COPD and a hx chronic PE (small and chronic, not treated). Drainage is well controlled. She has occasional cough, not every day. Activity is a bit limited, trouble with heat and cold weather. Able to do her house work, limited a bit with her gardening. She has had a local rxn to flu shot before.    Review of Systems As per HPI  Past Medical History  Diagnosis Date  . Benign neoplasm of kidney   . Hyperlipidemia   . Hypothyroid   . Chronic pulmonary embolism ([place])   . COPD with emphysema ([place])   . Bladder tumor   . History of falling   . Blood transfusion   . Complication of anesthesia 05-07-2010 SURGERY---  POST OP ACUTE HYPOXIA WHEN TAKEN OFF OXYGEN--   SECONDARY COPD STAGE III/ CHRONIC PE  . Hematuria   . Blood transfusion without reported diagnosis   . Cataract     developing per last eye exam  . Lichen sclerosus      Family History  Problem Relation Age of Onset  . Heart disease Mother   . Stroke Mother   . Heart  disease Father   . Colon cancer Neg Hx   . Esophageal cancer Neg Hx   . Rectal cancer Neg Hx   . Stomach cancer Neg Hx      Social History   Social History  . Marital Status: Divorced    Spouse Name: N/A  . Number of Children: Y  . Years of Education: N/A   Occupational History  . 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 Smoking status: Former Smoker -- 1.00 packs/day for 40 years    Types: Cigarettes    Quit date: 04/17/1998  . Smokeless tobacco: Never Used  . Alcohol Use: No  . Drug Use: No  . Sexual Activity: Not on file   Other Topics Concern  . Not on file   Social History Narrative     Allergies  Allergen Reactions  . Contrast Media [Iodinated Diagnostic Agents] Diarrhea    Oral contrast   . Penicillins Hives     Outpatient Prescriptions Prior to Visit  Medication Sig Dispense Refill  . albuterol (PROAIR HFA) 108 (90 Base) MCG/ACT inhaler Inhale 2 puffs into the lungs every 6 (six) hours as needed for wheezing or shortness of breath. 1 Inhaler 5  . aspirin 81 MG tablet Take  81 mg by mouth daily.     . Calcium Carb-Cholecalciferol (CALCIUM 600/VITAMIN D3 PO) Take 2 tablets by mouth daily.    . carvedilol (COREG) 12.5 MG tablet Take 12.5 mg by mouth 2 (two) times daily with a meal.     . cetirizine (ZYRTEC) 10 MG tablet Take 10 mg by mouth 2 (two) times daily.     . formoterol (FORADIL AEROLIZER) 12 MCG capsule for inhaler Place 1 capsule (12 mcg total) into inhaler and inhale 2 (two) times daily. 180 capsule 3  . levothyroxine (SYNTHROID, LEVOTHROID) 50 MCG tablet As directed    . NIFEdipine (PROCARDIA-XL/ADALAT CC) 60 MG 24 hr tablet Take 60 mg by mouth every morning.     . pravastatin (PRAVACHOL) 40 MG tablet Take 40 mg by mouth every morning.     . Probiotic Product (ALIGN) 4 MG CAPS Take 1 capsule by mouth daily.    Marland Kitchen SPIRIVA HANDIHALER 18 MCG inhalation capsule INHALE 1 PUFF DAILY 30 capsule 5  . vitamin C  (ASCORBIC ACID) 500 MG tablet Take 1,000 mg by mouth as needed.     . desonide (DESOWEN) 0.05 % cream daily.    . formoterol (FORADIL) 12 MCG capsule for inhaler Place 1 capsule (12 mcg total) into inhaler and inhale 2 (two) times daily. 60 capsule 11  . SEREVENT DISKUS 50 MCG/DOSE diskus inhaler INHALE 1 PUFF TWICE DAILY 60 g 5  . tacrolimus (PROTOPIC) 0.1 % ointment Apply topically daily.     No facility-administered medications prior to visit.         Objective:   Physical Exam  Filed Vitals:   11/02/15 1459  BP: 110/72  Pulse: 70  Height: 5\' 3"  (1.6 m)  Weight: 127 lb (57.607 kg)  SpO2: 93%   Gen: Pleasant, well-nourished, in no distress,  normal affect  ENT: No lesions,  mouth clear,  oropharynx clear, no postnasal drip  Neck: No JVD, no TMG, no carotid bruits  Lungs: No use of accessory muscles, clear without rales or rhonchi  Cardiovascular: RRR, heart sounds normal, no murmur or gallops, trace bilateral ankle edema  Musculoskeletal: No deformities, no cyanosis or clubbing, no lower extremity cords palpable, no calf pain  Neuro: alert, non focal  Skin: Warm, no lesions or rashes     Assessment & Plan:  COPD (chronic obstructive pulmonary disease) with emphysema Please continue Spiriva and Foradil as you are taking them  Use albuterol 2 puffs as needed for shortness of breath.  Your pneumonia shots are up to date.  We will avoid the flu shot due to local allergic reaction.  Follow with Dr Lamonte Sakai in 6 months or sooner if you have any problems   Baltazar Apo, MD, PhD 11/02/2015, 3:22 PM [facility] [phone] or if no answer [phone]

## 2015-11-02 NOTE — Patient Instructions (Addendum)
Please continue Spiriva and Foradil as you are taking them  Use albuterol 2 puffs as needed for shortness of breath.  Your pneumonia shots are up to date.  We will avoid the flu shot due to local allergic reaction.  Follow with Dr Lamonte Sakai in 6 months or sooner if you have any problems

## 2015-11-10 ENCOUNTER — Encounter: Payer: Self-pay | Admitting: Gastroenterology

## 2015-12-16 ENCOUNTER — Telehealth: Payer: Self-pay | Admitting: *Deleted

## 2015-12-16 NOTE — Telephone Encounter (Signed)
Dr Fuller Plan,  This patient is scheduled for a PV 9-11 with a colon with you 9-25 Monday, she has a hx of colon polyps.  Her last colon was 12-31-2013 here in the [facility] w/ MAC.   She has been diagnosed with COPD stage III with emphysema and chronic pulmonary embolism. She does not use 02 at home.   Can she proceed with her colon here due to her respiratory status?  In several notes pulmonary has stated severe COPD.  Please advise  Thanks for your time, Marijean Niemann

## 2015-12-16 NOTE — Telephone Encounter (Signed)
Proceed as scheduled  [name]  

## 2015-12-16 NOTE — Telephone Encounter (Signed)
Error

## 2015-12-16 NOTE — Telephone Encounter (Signed)
The [facility] criteria to move care to the hospital is O2 use. COPD, even severe, without O2 use is ok for [facility] procedures.

## 2015-12-29 ENCOUNTER — Ambulatory Visit (AMBULATORY_SURGERY_CENTER): Payer: Self-pay

## 2015-12-29 VITALS — Ht 63.0 in | Wt 123.8 lb

## 2015-12-29 DIAGNOSIS — Z860100 Personal history of colon polyps, unspecified: Secondary | ICD-10-CM

## 2015-12-29 MED ORDER — SUPREP BOWEL PREP KIT 17.5-3.13-1.6 GM/177ML PO SOLN: 1.0000 | Freq: Once | ORAL | 0 refills | Status: AC

## 2015-12-29 NOTE — Progress Notes (Signed)
No allergies to eggs or soy No past problems with anesthesia No diet meds No home oxygen  Declined emmi 

## 2016-01-03 ENCOUNTER — Telehealth: Payer: Self-pay | Admitting: Gastroenterology

## 2016-01-03 NOTE — Telephone Encounter (Signed)
Pt. Had questions about using inhaler the morning of procedure scheduled for 01/10/16,pt. Was instructed to use inhaler by 6 a.m. Morning of procedure and to bring all inhalers she uses with her day of procedure. Pt. Verbalizes understanding. Pt. wanted to know if she would be using receiving oxygen day of procedure,informed her that they use oxygen when they sedate patients.

## 2016-01-10 ENCOUNTER — Ambulatory Visit (AMBULATORY_SURGERY_CENTER): Payer: Medicare Other | Admitting: Gastroenterology

## 2016-01-10 ENCOUNTER — Encounter: Payer: Self-pay | Admitting: Gastroenterology

## 2016-01-10 VITALS — BP 108/58 | HR 63 | Temp 98.2°F | Resp 21 | Ht 63.0 in | Wt 123.0 lb

## 2016-01-10 DIAGNOSIS — Z86010 Personal history of colonic polyps: Secondary | ICD-10-CM | POA: Diagnosis not present

## 2016-01-10 DIAGNOSIS — D123 Benign neoplasm of transverse colon: Secondary | ICD-10-CM | POA: Diagnosis not present

## 2016-01-10 DIAGNOSIS — D125 Benign neoplasm of sigmoid colon: Secondary | ICD-10-CM | POA: Diagnosis not present

## 2016-01-10 DIAGNOSIS — D124 Benign neoplasm of descending colon: Secondary | ICD-10-CM

## 2016-01-10 MED ORDER — SODIUM CHLORIDE 0.9 % IV SOLN: 500.0000 mL | INTRAVENOUS | Status: AC

## 2016-01-10 NOTE — Op Note (Addendum)
[place] Patient Name: [name] Procedure Date: 01/10/2016 9:08 AM MRN: [id number] Endoscopist: Ladene Artist , MD Age: 75 Referring MD:  Date of Birth: [birth year] Gender: Female Account #: [id number] Procedure:                Colonoscopy Indications:              Surveillance: History of numerous (> 10) adenomas                            on last colonoscopy (< 3 yrs) Medicines:                Monitored Anesthesia Care Procedure:                Pre-Anesthesia Assessment:                           - Prior to the procedure, a History and Physical                            was performed, and patient medications and                            allergies were reviewed. The patient's tolerance of                            previous anesthesia was also reviewed. The risks                            and benefits of the procedure and the sedation                            options and risks were discussed with the patient.                            All questions were answered, and informed consent                            was obtained. Prior Anticoagulants: The patient has                            taken no previous anticoagulant or antiplatelet                            agents. ASA Grade Assessment: III - A patient with                            severe systemic disease. After reviewing the risks                            and benefits, the patient was deemed in                            satisfactory condition to undergo the procedure.  After obtaining informed consent, the colonoscope                            was passed under direct vision. Throughout the                            procedure, the patient's blood pressure, pulse, and                            oxygen saturations were monitored continuously. The                            Model PCF-H190L [id number]) scope was introduced                            through the anus and  advanced to the the cecum,                            identified by appendiceal orifice and ileocecal                            valve. The ileocecal valve, appendiceal orifice,                            and rectum were photographed. The quality of the                            bowel preparation was good. The colonoscopy was                            performed without difficulty. The patient tolerated                            the procedure well. Scope In: 9:24:41 AM Scope Out: 9:37:24 AM Scope Withdrawal Time: 0 hours 9 minutes 31 seconds  Total Procedure Duration: 0 hours 12 minutes 43 seconds  Findings:                 The perianal and digital rectal examinations were                            normal.                           A 20 mm polyp was found in the sigmoid colon. The                            polyp was semi-pedunculated. The polyp was removed                            with a hot snare. Resection and retrieval were                            complete.  A 5 mm polyp was found in the descending colon. The                            polyp was sessile. The polyp was removed with a                            cold snare. Resection and retrieval were complete.                           A 4 mm polyp was found in the transverse colon. The                            polyp was sessile. The polyp was removed with a                            cold biopsy forceps. Resection and retrieval were                            complete.                           Many medium-mouthed diverticula were found in the                            sigmoid colon. There was no evidence of                            diverticular bleeding.                           The exam was otherwise without abnormality on                            direct views. Unable to retroflex due to a narrow                            rectal vault. Complications:            No immediate complications.  Estimated blood loss:                            None. Estimated Blood Loss:     Estimated blood loss: none. Impression:               - One 20 mm polyp in the sigmoid colon, removed                            with a hot snare. Resected and retrieved.                           - One 5 mm polyp in the descending colon, removed                            with a cold snare. Resected and retrieved.                           -  One 4 mm polyp in the transverse colon, removed                            with a cold biopsy forceps. Resected and retrieved.                           - Moderate diverticulosis in the sigmoid colon.                            There was no evidence of diverticular bleeding.                           - The examination was otherwise normal on direct                            views. Recommendation:           - Repeat colonoscopy in 3 years for surveillance.                           - Patient has a contact number available for                            emergencies. The signs and symptoms of potential                            delayed complications were discussed with the                            patient. Return to normal activities tomorrow.                            Written discharge instructions were provided to the                            patient.                           - Resume previous diet.                           - Continue present medications.                           - Await pathology results.                           - No aspirin, ibuprofen, naproxen, or other                            non-steroidal anti-inflammatory drugs for 2 weeks                            after polyp removal. Ladene Artist, MD 01/10/2016 9:46:17 AM This report has been signed electronically.

## 2016-01-10 NOTE — Progress Notes (Signed)
Report to PACU, RN, vss, BBS= Clear.  

## 2016-01-10 NOTE — Progress Notes (Signed)
Called to room to assist during endoscopic procedure.  Patient ID and intended procedure confirmed with present staff. Received instructions for my participation in the procedure from the performing physician.  

## 2016-01-10 NOTE — Patient Instructions (Signed)
YOU HAD AN ENDOSCOPIC PROCEDURE TODAY AT [facility]:   Refer to the procedure report that was given to you for any specific questions about what was found during the examination.  If the procedure report does not answer your questions, please call your gastroenterologist to clarify.  If you requested that your care partner not be given the details of your procedure findings, then the procedure report has been included in a sealed envelope for you to review at your convenience later.  YOU SHOULD EXPECT: Some feelings of bloating in the abdomen. Passage of more gas than usual.  Walking can help get rid of the air that was put into your GI tract during the procedure and reduce the bloating. If you had a lower endoscopy (such as a colonoscopy or flexible sigmoidoscopy) you may notice spotting of blood in your stool or on the toilet paper. If you underwent a bowel prep for your procedure, you may not have a normal bowel movement for a few days.  Please Note:  You might notice some irritation and congestion in your nose or some drainage.  This is from the oxygen used during your procedure.  There is no need for concern and it should clear up in a day or so.  SYMPTOMS TO REPORT IMMEDIATELY:   Following lower endoscopy (colonoscopy or flexible sigmoidoscopy):  Excessive amounts of blood in the stool  Significant tenderness or worsening of abdominal pains  Swelling of the abdomen that is new, acute  Fever of 100F or higher   For urgent or emergent issues, a gastroenterologist can be reached at any hour by calling [phone].   DIET:  We do recommend a small meal at first, but then you may proceed to your regular diet.  Drink plenty of fluids but you should avoid alcoholic beverages for 24 hours. Try to increase the fiber in your diet, and drink plenty of water. ACTIVITY:  You should plan to take it easy for the rest of today and you should NOT DRIVE or use heavy machinery until  tomorrow (because of the sedation medicines used during the test).    FOLLOW UP: Our staff will call the number listed on your records the next business day following your procedure to check on you and address any questions or concerns that you may have regarding the information given to you following your procedure. If we do not reach you, we will leave a message.  However, if you are feeling well and you are not experiencing any problems, there is no need to return our call.  We will assume that you have returned to your regular daily activities without incident.  If any biopsies were taken you will be contacted by phone or by letter within the next 1-3 weeks.  Please call us at [phone] if you have not heard about the biopsies in 3 weeks.    SIGNATURES/CONFIDENTIALITY: You and/or your care partner have signed paperwork which will be entered into your electronic medical record.  These signatures attest to the fact that that the information above on your After Visit Summary has been reviewed and is understood.  Full responsibility of the confidentiality of this discharge information lies with you and/or your care-partner.  Read all of the handouts given to you  by your recovery room nurse. Thank-you for choosing [place] for your healthcare needs today.   NO ASPIRIN, IBUPROFEN OR ALEVE FOR TWO WEEKS PER DR STARK.    YOU SHOULD HAVE ANOTHER  COLONOSCOPY IN 3 YEARS.

## 2016-01-11 ENCOUNTER — Telehealth: Payer: Self-pay | Admitting: *Deleted

## 2016-01-11 NOTE — Telephone Encounter (Signed)
  Follow up Call-  Call back number 01/10/2016 12/31/2013  Post procedure Call Back phone  # [phone]  Permission to leave phone message Yes Yes  Some recent data might be hidden     Patient questions:  Do you have a fever, pain , or abdominal swelling? No. Pain Score  0 *  Have you tolerated food without any problems? Yes.    Have you been able to return to your normal activities? Yes.    Do you have any questions about your discharge instructions: Diet   No. Medications  No. Follow up visit  No.  Do you have questions or concerns about your Care? No.  Actions: * If pain score is 4 or above: No action needed, pain <4.

## 2016-01-26 ENCOUNTER — Encounter: Payer: Self-pay | Admitting: Gastroenterology

## 2016-01-28 ENCOUNTER — Telehealth: Payer: Self-pay | Admitting: Gastroenterology

## 2016-01-28 ENCOUNTER — Telehealth: Payer: Self-pay | Admitting: Emergency Medicine

## 2016-01-28 NOTE — Telephone Encounter (Signed)
Patient notified of the results .  All of her questions were answered. She will call back for any additional questions or concerns

## 2016-01-28 NOTE — Telephone Encounter (Signed)
This is an FYI to Lamont from pts PCP office.  Nothing further needed.

## 2016-01-28 NOTE — Telephone Encounter (Signed)
Please call her and ask her to make an OV with me to discuss

## 2016-01-28 NOTE — Telephone Encounter (Signed)
Called and spoke to pt. Informed pt of the recs per RB, pt needing to come in for an appt. Pt verbalized understanding but states she is feeling fine - at baseline- and stated she doesn't feel an appt is necessary, refused appt with RB. Pt states if her breathing becomes more labored or worsens then she will call for an appt. Nothing further needed at this time.   Will send to [place] as FYI.

## 2016-03-26 ENCOUNTER — Encounter (HOSPITAL_COMMUNITY): Payer: Self-pay | Admitting: Emergency Medicine

## 2016-03-26 ENCOUNTER — Emergency Department (HOSPITAL_COMMUNITY): Payer: Medicare Other

## 2016-03-26 ENCOUNTER — Emergency Department (HOSPITAL_COMMUNITY)
Admission: EM | Admit: 2016-03-26 | Discharge: 2016-03-27 | Disposition: A | Discharge status: Home / Self Care | Payer: Medicare Other | Attending: Emergency Medicine | Admitting: Emergency Medicine

## 2016-03-26 DIAGNOSIS — Z87891 Personal history of nicotine dependence: Secondary | ICD-10-CM | POA: Insufficient documentation

## 2016-03-26 DIAGNOSIS — I10 Essential (primary) hypertension: Secondary | ICD-10-CM | POA: Insufficient documentation

## 2016-03-26 DIAGNOSIS — J441 Chronic obstructive pulmonary disease with (acute) exacerbation: Secondary | ICD-10-CM | POA: Diagnosis not present

## 2016-03-26 DIAGNOSIS — Z7982 Long term (current) use of aspirin: Secondary | ICD-10-CM | POA: Diagnosis not present

## 2016-03-26 DIAGNOSIS — Z96611 Presence of right artificial shoulder joint: Secondary | ICD-10-CM | POA: Insufficient documentation

## 2016-03-26 DIAGNOSIS — Z8551 Personal history of malignant neoplasm of bladder: Secondary | ICD-10-CM | POA: Diagnosis not present

## 2016-03-26 DIAGNOSIS — E039 Hypothyroidism, unspecified: Secondary | ICD-10-CM | POA: Insufficient documentation

## 2016-03-26 DIAGNOSIS — Z79899 Other long term (current) drug therapy: Secondary | ICD-10-CM | POA: Diagnosis not present

## 2016-03-26 DIAGNOSIS — R0602 Shortness of breath: Secondary | ICD-10-CM | POA: Diagnosis present

## 2016-03-26 LAB — BASIC METABOLIC PANEL
ANION GAP: 7 (ref 5–15)
BUN: 16 mg/dL (ref 6–20)
CALCIUM: 9.5 mg/dL (ref 8.9–10.3)
CO2: 28 mmol/L (ref 22–32)
Chloride: 104 mmol/L (ref 101–111)
Creatinine, Ser: 1.45 mg/dL — ABNORMAL HIGH (ref 0.44–1.00)
GFR calc Af Amer: 39 mL/min — ABNORMAL LOW (ref >60)
GFR calc non Af Amer: 34 mL/min — ABNORMAL LOW (ref >60)
GLUCOSE: 147 mg/dL — AB (ref 65–99)
Potassium: 4.4 mmol/L (ref 3.5–5.1)
Sodium: 139 mmol/L (ref 135–145)

## 2016-03-26 LAB — CBC
HCT: 46.2 % — ABNORMAL HIGH (ref 36.0–46.0)
HEMOGLOBIN: 16 g/dL — AB (ref 12.0–15.0)
MCH: 29 pg (ref 26.0–34.0)
MCHC: 34.6 g/dL (ref 30.0–36.0)
MCV: 83.7 fL (ref 78.0–100.0)
PLATELETS: 321 10*3/uL (ref 150–400)
RBC: 5.52 MIL/uL — ABNORMAL HIGH (ref 3.87–5.11)
RDW: 13.1 % (ref 11.5–15.5)
WBC: 14 10*3/uL — ABNORMAL HIGH (ref 4.0–10.5)

## 2016-03-26 MED ORDER — ALBUTEROL SULFATE (2.5 MG/3ML) 0.083% IN NEBU
5.0000 mg | INHALATION_SOLUTION | Freq: Once | RESPIRATORY_TRACT | Status: AC
  Administered 2016-03-26: 5 mg via RESPIRATORY_TRACT
  Filled 2016-03-26: qty 6

## 2016-03-26 MED ORDER — IPRATROPIUM BROMIDE 0.02 % IN SOLN
0.5000 mg | Freq: Once | RESPIRATORY_TRACT | Status: AC
  Administered 2016-03-26: 0.5 mg via RESPIRATORY_TRACT
  Filled 2016-03-26: qty 2.5

## 2016-03-26 MED ORDER — ALBUTEROL SULFATE (2.5 MG/3ML) 0.083% IN NEBU: 5.0000 mg | INHALATION_SOLUTION | Freq: Once | RESPIRATORY_TRACT | Status: DC

## 2016-03-26 NOTE — ED Triage Notes (Signed)
Pt BIB EMS from home c/o sudden onset of SOB that began around 6:30pm; 80% on RA when EMS arrived at pt's home; neb treatment in process on arrival with pt reporting some improvement in breathing; hx of COPD; pt states that she did not take her breathing medicines this evening  10mg  albuterol, 0.5mg  atrovent, 125mg  solumedrol given by EMS

## 2016-03-26 NOTE — ED Provider Notes (Signed)
[facility] DEPT Provider Note   CSN: [id number] Arrival date & time: 03/26/16  2149     History   Chief Complaint Chief Complaint  Patient presents with  . Shortness of Breath    HPI [name] is a 75 y.o. female.  HPI  Pt with hx of COPD presents with shortness of breath.  She had some congested breathing last night, tonight she was on her computer and began to feel tightness of her breathing.  She tried using her albuterol inhaler but it did not help much.  She felt like she couldn't get a deep breath in.  No chest pain.  No fever/chills.  She does not use oxygen at home.  Does not have frequent problems with her COPD.    Past Medical History:  Diagnosis Date  . Allergy   . Anxiety   . Benign neoplasm of kidney   . Bladder tumor   . Blood transfusion   . Blood transfusion without reported diagnosis   . Cancer ([place])    bladder  . Cataract    developing per last eye exam  . 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 Complication of anesthesia 05-07-2010 SURGERY---  POST OP ACUTE HYPOXIA WHEN TAKEN OFF OXYGEN--   SECONDARY COPD STAGE III/ CHRONIC PE  . COPD with emphysema ([place])   . Depression   . Emphysema of lung ([place])   . Hematuria   . History of falling   . Hyperlipidemia   . Hypertension   . Hypothyroid   . Lichen sclerosus   . Osteoporosis     Patient Active Problem List   Diagnosis Date Noted  . Lower extremity edema 08/03/2015  . Bladder tumor 09/18/2011  . Thrush 12/05/2010  . Other pulmonary embolism and infarction 05/27/2010  . NEOPLASM, BENIGN, 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  . HYPOTHYROIDISM 02/01/2009  . HYPERLIPIDEMIA 02/01/2009  . COPD (chronic obstructive pulmonary disease) with emphysema ([place]) 02/01/2009    Past Surgical History:  Procedure Laterality Date  . COLONOSCOPY    . ORIF RIGHT FEMORAL NECK FX/ REVERSED TOTAL RIGHT SHOULDER ATHROPLASTY  05-07-2010   COMMINUTED DISPLACED PROXIMAL HUMEROUS FX  . POLYPECTOMY    . TOTAL SHOULDER  REPLACEMENT  05-07-2010   right  . TRANSURETHRAL RESECTION OF BLADDER TUMOR  09/18/2011   Procedure: TRANSURETHRAL RESECTION OF BLADDER TUMOR (TURBT);  Surgeon: Claybon Jabs, MD;  Location: [facility];  Service: Urology;  Laterality: N/A;  . VAGINAL HYSTERECTOMY  1970'S    OB History    No data available       Home Medications    Prior to Admission medications   Medication Sig Start Date End Date Taking? Authorizing Provider  albuterol (PROAIR HFA) 108 (90 Base) MCG/ACT inhaler Inhale 2 puffs into the lungs every 6 (six) hours as needed for wheezing or shortness of breath. 08/06/15   Collene Gobble, MD  aspirin 81 MG tablet Take 81 mg by mouth daily.     Historical Provider, MD  Calcium Carb-Cholecalciferol (CALCIUM 600/VITAMIN D3 PO) Take 2 tablets by mouth daily.    Historical Provider, MD  carvedilol (COREG) 12.5 MG tablet Take 12.5 mg by mouth 2 (two) times daily with a meal.     Historical Provider, MD  cetirizine (ZYRTEC) 10 MG tablet Take 10 mg by mouth 2 (two) times daily.     Historical Provider, MD  desonide (DESOWEN) 0.05 % cream Apply topically.    Historical Provider, MD  formoterol (FORADIL AEROLIZER) 12 MCG  capsule for inhaler Place 1 capsule (12 mcg total) into inhaler and inhale 2 (two) times daily. 09/29/15   Collene Gobble, MD  levothyroxine (SYNTHROID, LEVOTHROID) 50 MCG tablet As directed    Historical Provider, MD  NIFEdipine (PROCARDIA-XL/ADALAT CC) 60 MG 24 hr tablet Take 60 mg by mouth every morning.     Historical Provider, MD  pimecrolimus (ELIDEL) 1 % cream APP TO THE SKIN Q NIGHT 10/15/15   Historical Provider, MD  pravastatin (PRAVACHOL) 40 MG tablet Take 40 mg by mouth every morning.     Historical Provider, MD  Probiotic Product (ALIGN) 4 MG CAPS Take 1 capsule by mouth daily.    Historical Provider, MD  SPIRIVA HANDIHALER 18 MCG inhalation capsule INHALE 1 PUFF DAILY 10/21/15   Collene Gobble, MD  vitamin C (ASCORBIC ACID) 500 MG tablet Take  1,000 mg by mouth as needed.     Historical Provider, MD    Family History Family History  Problem Relation Age of Onset  . Heart disease Mother   . Stroke Mother   . Heart disease Father   . Colon cancer Neg Hx   . Esophageal cancer Neg Hx   . Rectal cancer Neg Hx   . Stomach cancer Neg Hx     Social History Social History  Substance Use Topics  . Smoking status: Former Smoker    Packs/day: 1.00    Years: 40.00    Types: Cigarettes    Quit date: 04/17/1998  . Smokeless tobacco: Never Used  . Alcohol use No     Allergies   Contrast media [iodinated diagnostic agents] and Penicillins   Review of Systems Review of Systems  ROS reviewed and all otherwise negative except for mentioned in HPI   Physical Exam Updated Vital Signs BP 169/86 (BP Location: Right Arm)   Pulse 100   Temp 97.3 F (36.3 C) (Oral)   Resp 25   SpO2 100%  Vitals reviewed Physical Exam Physical Examination: General appearance - alert, well appearing, and in no distress Mental status - alert, oriented to person, place, and time Eyes - no conjunctival injection no scleral icterus Mouth - mucous membranes moist, pharynx normal without lesions Chest - decreased breath sounds bilaterally with expiratory wheezing, breath sounds symmetric. Heart - normal rate, regular rhythm, normal S1, S2, no murmurs, rubs, clicks or gallops Abdomen - soft, nontender, nondistended, no masses or organomegaly Neurological - alert, oriented, normal speech Extremities - peripheral pulses normal, no pedal edema, no clubbing or cyanosis Skin - normal coloration and turgor, no rashes  ED Treatments / Results  Labs (all labs ordered are listed, but only abnormal results are displayed) Labs Reviewed  BASIC METABOLIC PANEL  CBC    EKG  EKG Interpretation None       Radiology No results found.  Procedures Procedures (including critical care time)  Medications Ordered in ED Medications - No data to  display   Initial Impression / Assessment and Plan / ED Course  I have reviewed the triage vital signs and the nursing notes.  Pertinent labs & imaging results that were available during my care of the patient were reviewed by me and considered in my medical decision making (see chart for details).  Clinical Course   12:45 AM pt is feeling much improved after nebs.  Will place on steroids by mouth- had received IV steroids via EMS.  Will also cover with avelox for bronchitis due to elevated WBC.    Pt with  hx of COPD presenting with whezing and difficulty breathing.  Pt feels much improved after albuterol/atrovent.  CXR reassuring.  Pt started on prednisone as well.  Pt and daughter are comfortable with plan for discharge. Discharged with strict return precautions.  Pt agreeable with plan.  Final Clinical Impressions(s) / ED Diagnoses   Final diagnoses:  SOB (shortness of breath)  COPD exacerbation  New Prescriptions New Prescriptions   No medications on file     Alfonzo Beers, MD 03/29/16 1836

## 2016-03-26 NOTE — ED Notes (Signed)
Bed: RESA Expected date:  Expected time:  Means of arrival:  Comments: EMS 75 yo female SOB/hx COPD-initial sat 80%/neb treatment

## 2016-03-27 ENCOUNTER — Telehealth: Payer: Self-pay | Admitting: Emergency Medicine

## 2016-03-27 DIAGNOSIS — J441 Chronic obstructive pulmonary disease with (acute) exacerbation: Secondary | ICD-10-CM | POA: Diagnosis not present

## 2016-03-27 MED ORDER — PREDNISONE 10 MG (21) PO TBPK: 10.0000 mg | ORAL_TABLET | Freq: Every day | ORAL | 0 refills | Status: DC

## 2016-03-27 MED ORDER — LEVOFLOXACIN 500 MG PO TABS
500.0000 mg | ORAL_TABLET | Freq: Once | ORAL | Status: AC
  Administered 2016-03-27: 500 mg via ORAL
  Filled 2016-03-27: qty 1

## 2016-03-27 MED ORDER — MOXIFLOXACIN HCL 400 MG PO TABS: 400.0000 mg | ORAL_TABLET | Freq: Every day | ORAL | 0 refills | Status: DC

## 2016-03-27 NOTE — Telephone Encounter (Signed)
Called and spoke to pt. Pt states she was seen in [facility] ED on 03/26/16. Pt states she is feeling so much better now. Pt states she moved her appt up from 04/2016 to 04/11/16. Advised pt that if she begins to feel bad again to call to schedule an appt. Pt verbalized understanding and denied any further questions or concerns at this time.

## 2016-03-27 NOTE — Discharge Instructions (Signed)
Return to the ED with any concerns including difficulty breathing despite using albuterol every 4 hours, not drinking fluids, decreased urine output, vomiting and not able to keep down liquids or medications, decreased level of alertness/lethargy, or any other alarming symptoms °

## 2016-04-11 ENCOUNTER — Ambulatory Visit (INDEPENDENT_AMBULATORY_CARE_PROVIDER_SITE_OTHER): Payer: Medicare Other | Admitting: Internal Medicine

## 2016-04-11 ENCOUNTER — Encounter: Payer: Self-pay | Admitting: Internal Medicine

## 2016-04-11 VITALS — BP 110/58 | HR 79 | Ht 63.0 in | Wt 118.6 lb

## 2016-04-11 DIAGNOSIS — J441 Chronic obstructive pulmonary disease with (acute) exacerbation: Secondary | ICD-10-CM

## 2016-04-11 MED ORDER — TIOTROPIUM BROMIDE-OLODATEROL 2.5-2.5 MCG/ACT IN AERS: 2.0000 | INHALATION_SPRAY | Freq: Every day | RESPIRATORY_TRACT | 0 refills | Status: DC

## 2016-04-11 MED ORDER — TIOTROPIUM BROMIDE-OLODATEROL 2.5-2.5 MCG/ACT IN AERS: 2.0000 | INHALATION_SPRAY | Freq: Every day | RESPIRATORY_TRACT | 11 refills | Status: DC

## 2016-04-11 NOTE — Progress Notes (Addendum)
Subjective:    Patient ID: [name], female    DOB: Apr 17, 1941, 75 y.o.   MRN: [id number]  Brief patient profile:  62 yowf former smoker (40 pack years) quitting in 2000 and history of hyperthyroidism and chronic pulmonary emboli, and followed previously by Dr Gwenette Greet for COPD.        ROV 11/02/15 [place]-- follow up for COPD and a hx chronic PE (small and chronic, not treated). Drainage is well controlled. She has occasional cough, not every day. Activity is a bit limited, trouble with heat and cold weather. Able to do her house work, limited a bit with her gardening. She has had a local rxn to flu shot before. rec Please continue Spiriva and Foradil as you are taking them  Use albuterol 2 puffs as needed for shortness of breath.  Your pneumonia shots are up to date.  We will avoid the flu shot due to local allergic reaction.      04/11/2016 post ER extended  f/u ov/Bayley Hurn re: s/p aecopd maint rx = spiriva/foradil dpi's and saba hfa prn Chief Complaint  Patient presents with  . Follow-up    COPD exacerbation, pt feels ok today, she feels she has to take deeper breaths   baseline = no 02 / no need for saba but no longer shopping due to doe/  03/26/16 ? Exp to?  moldy humdifier vs cold air  then abruptly lost breath s cough/ Pos wheezing on arrival to  ER w/in an hour of onset of symptoms and   rx as  aecopd with nebs and prednisone and improved but not quit back to [place].  No assoc nasal symptoms/ cp, just sensation of gen tightness and could not breath in no better with rescue saba at time of "attack"  - denies loss of voice/ choking on food or any new symptoms to suggest dvt  No obvious day to day or daytime variability or assoc excess/ purulent sputum or mucus plugs or hemoptysis or cp or chest tightness, subjective wheeze or overt sinus or hb symptoms. No unusual exp hx or h/o childhood pna/ asthma or knowledge of premature birth.  Sleeping ok without nocturnal  or early am  exacerbation  of respiratory  c/o's or need for noct saba. Also denies any obvious fluctuation of symptoms with weather or environmental changes or other aggravating or alleviating factors except as outlined above   Current Medications, Allergies, Complete Past Medical History, Past Surgical History, Family History, and Social History were reviewed in [employer] record.  ROS  The following are not active complaints unless bolded sore throat, dysphagia, dental problems, itching, sneezing,  nasal congestion or excess/ purulent secretions, ear ache,   fever, chills, sweats, unintended wt loss, classically pleuritic or exertional cp,  orthopnea pnd or leg swelling, presyncope, palpitations, abdominal pain, anorexia, nausea, vomiting, diarrhea  or change in bowel or bladder habits, change in stools or urine, dysuria,hematuria,  rash, arthralgias, visual complaints, headache, numbness, weakness or ataxia or problems with walking or coordination,  change in mood/affect or memory.            Objective:   Physical Exam   Thin amb wf nad    Wt Readings from Last 3 Encounters:  04/11/16 118 lb 9.6 oz (53.8 kg)  01/10/16 123 lb (55.8 kg)  12/29/15 123 lb 12.8 oz (56.2 kg)    Vital signs reviewed  - Note on arrival 02 sats  91 % on RA  Gen: Pleasant, well-nourished, in no distress,  normal affect  ENT: No lesions,  mouth clear,  oropharynx clear, no postnasal drip  Neck: No JVD, no TMG, no carotid bruits  Lungs: she does use her neck muscles to breath/ chest is hyperresonant and bs are distant bilaterally s adventitial sounds   Cardiovascular: RRR, heart sounds normal, no murmur or gallops, trace bilateral ankle edema  Musculoskeletal: No deformities, no cyanosis or clubbing, no lower extremity cords palpable, no calf pain  Neuro: alert, non focal  Skin: Warm, no lesions or rashes     Assessment & Plan:

## 2016-04-11 NOTE — Patient Instructions (Addendum)
Plan A = Automatic = stop foradil and spiriva powder and just use the stiolto 2 puffs each am   Plan B = Backup Only use your albuterol as a rescue medication to be used if you can't catch your breath by resting or doing a relaxed purse lip breathing pattern.  - The less you use it, the better it will work when you need it. - Ok to use the inhaler up to 2 puffs  every 4 hours if you must but call for appointment if use goes up over your usual need - Don't leave home without it !!  (think of it like the spare tire for your car)    Please schedule a follow up office visit in 6 weeks, call sooner if needed to see Dr Lamonte Sakai

## 2016-04-12 ENCOUNTER — Encounter: Payer: Self-pay | Admitting: Internal Medicine

## 2016-04-12 DIAGNOSIS — J441 Chronic obstructive pulmonary disease with (acute) exacerbation: Secondary | ICD-10-CM | POA: Insufficient documentation

## 2016-04-12 NOTE — Assessment & Plan Note (Addendum)
PFT's 2010:  FEV1 0.94 (40%), ratio 34, DLCO 62% Perceived intolerance to ICS, LABA due to "thrush" Back on spiriva/foradil and doing well.    See ER notes 03/26/16  Offered to refer to pulmonary rehab.04/11/2016   Very unusual presentation for a copd exac and if not for her apparent response to neb/steroids I would have bet she had recurrent PE or MI or VCD but she's back near baseline now s anticoagulation and p rx for copd but still breathing at high lung volumes due to air trapping  and using acc muscles at rest so I suspect she's just living on the edge with limited IC at baseline which places her insp muscles at an extreme disadvantage so the slightest rise in RR for any reason, including anxiety, can cause decompensation   rec trial of stiolto in place of dpi's and review how to use rescue rx  - The proper method of use, as well as anticipated side effects, of a metered-dose inhaler are discussed and demonstrated to the patient. Improved effectiveness after extensive coaching during this visit to a level of approximately 75 % from a baseline of 50 % baseline but showed she could handle to respimat ok   I had an extended discussion with the patient reviewing all relevant studies completed to date and  lasting 25 minutes of a 40  minute transition of care/ post ER  visit in pt not prev known to me  re  non-specific but potentially very serious pulmonary symptoms of unknown etiology.  Each maintenance medication was reviewed in detail including most importantly the difference between maintenance and prns and under what circumstances the prns are to be triggered using an action plan format that is not reflected in the computer generated alphabetically organized AVS.    Please see AVS for unique instructions that I personally wrote and verbalized to the the pt in detail and then reviewed with pt  by my nurse highlighting any  changes in therapy recommended at today's visit to their plan of care.

## 2016-05-09 ENCOUNTER — Ambulatory Visit: Payer: Medicare Other | Admitting: Emergency Medicine

## 2016-05-16 ENCOUNTER — Ambulatory Visit (INDEPENDENT_AMBULATORY_CARE_PROVIDER_SITE_OTHER): Payer: Medicare Other | Admitting: Emergency Medicine

## 2016-05-16 ENCOUNTER — Encounter: Payer: Self-pay | Admitting: Emergency Medicine

## 2016-05-16 DIAGNOSIS — J439 Emphysema, unspecified: Secondary | ICD-10-CM

## 2016-05-16 NOTE — Patient Instructions (Addendum)
Please continue your Stiolto 2 puffs once a day Take albuterol 2 puffs up to every 4 hours if needed for shortness of breath.  Continue your zyrtec  Follow with Dr Lamonte Sakai in 4 months or sooner if you have any problems.

## 2016-05-16 NOTE — Assessment & Plan Note (Signed)
She has improved since her exacerbation in the beginning of December. She appears to have benefited from the change to Stiolto (from Spiriva and Foradil). I will continue her on this. She will use albuterol as needed. She did not want the flu shot because she had a local skin reaction when she got the shot last year. Follow in 4 months.

## 2016-05-16 NOTE — Progress Notes (Signed)
Subjective:    Patient ID: [name], female    DOB: 1941/04/07, 76 y.o.   MRN: [id number]  HPI 76 yo woman, former smoker (40 pack years), history of hyperthyroidism and chronic pulmonary emboli, and followed by Dr Gwenette Greet for COPD. She was last seen in our office by T Parrett at which time her meds were adjusted to Serevent + Spiriva. Since making the change she has been doing better. She feels that she may have lost some ground initially but has rebounded. She rarely uses albuterol. Minimal cough and mucous. No CP or tightness. No flares reported. She does have some SOB with stairs.   ROV 08/03/15 -- follow-up visit for patient with COPD and a history of chronic pulmonary emboli. She is currently managed on Spiriva and Serevent, because foradil stopped manufacturing in the [place].  She has been more dyspneic since our last visit. Some ankle swelling that she has noted over the last month. Bilateral but more on the L. No calf pain.   ROV 11/02/15 -- follow up for COPD and a hx chronic PE (small and chronic, not treated). Drainage is well controlled. She has occasional cough, not every day. Activity is a bit limited, trouble with heat and cold weather. Able to do her house work, limited a bit with her gardening. She has had a local rxn to flu shot before.   ROV 05/16/16 -- This follow-up visit for COPD, history of chronic small pulmonary emboli (not currently on anticoagulation). She was unfortunately went to ED 03/26/16 with an acute exacerbation of her COPD. She was seen here on 04/11/16 Dr Melvyn Novas. She was treated with steroids, avelox, and BD's in the ED. At her OV w Dr Melvyn Novas she did a switch to [employer]. She liked the once a day schedule. Didn't seem to lose any ground. She has ProAir, uses about 1-2x a week. She feels that she is improved, closing in on her usual baseline.    Review of Systems As per HPI  Past Medical History:  Diagnosis Date  . Allergy   . Anxiety   . Benign neoplasm of  kidney   . Bladder tumor   . Blood transfusion   . Blood transfusion without reported diagnosis   . Cancer ([place])    bladder  . Cataract    developing per last eye exam  . 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 Complication of anesthesia 05-07-2010 SURGERY---  POST OP ACUTE HYPOXIA WHEN TAKEN OFF OXYGEN--   SECONDARY COPD STAGE III/ CHRONIC PE  . COPD with emphysema ([place])   . Depression   . Emphysema of lung ([place])   . Hematuria   . History of falling   . Hyperlipidemia   . Hypertension   . Hypothyroid   . Lichen sclerosus   . Osteoporosis      Family History  Problem Relation Age of Onset  . Heart disease Mother   . Stroke Mother   . Heart disease Father   . Colon cancer Neg Hx   . Esophageal cancer Neg Hx   . Rectal cancer Neg Hx   . Stomach cancer Neg Hx      Social History   Social History  . Marital status: Divorced    Spouse name: N/A  . Number of children: Y  . Years of education: N/A   Occupational History  . 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 Smoking status: Former Smoker  Packs/day: 1.00    Years: 40.00    Types: Cigarettes    Quit date: 04/17/1998  . Smokeless tobacco: Never Used  . Alcohol use No  . Drug use: No  . Sexual activity: Not on file   Other Topics Concern  . Not on file   Social History Narrative  . No narrative on file     Allergies  Allergen Reactions  . Contrast Media [Iodinated Diagnostic Agents] Diarrhea    Oral contrast   . Penicillins Hives     Outpatient Medications Prior to Visit  Medication Sig Dispense Refill  . albuterol (PROAIR HFA) 108 (90 Base) MCG/ACT inhaler Inhale 2 puffs into the lungs every 6 (six) hours as needed for wheezing or shortness of breath. 1 Inhaler 5  . aspirin 81 MG tablet Take 81 mg by mouth daily.     . Calcium Carb-Cholecalciferol (CALCIUM 600/VITAMIN D3 PO) Take 2 tablets by mouth daily.    . carvedilol (COREG) 12.5 MG tablet Take 12.5 mg by  mouth 2 (two) times daily with a meal.     . cetirizine (ZYRTEC) 10 MG tablet Take 10 mg by mouth 2 (two) times daily.     Marland Kitchen levothyroxine (SYNTHROID, LEVOTHROID) 50 MCG tablet As directed    . NIFEdipine (PROCARDIA-XL/ADALAT CC) 60 MG 24 hr tablet Take 60 mg by mouth every morning.     . pravastatin (PRAVACHOL) 40 MG tablet Take 40 mg by mouth every morning.     . Probiotic Product (ALIGN) 4 MG CAPS Take 1 capsule by mouth daily.    . Tiotropium Bromide-Olodaterol (STIOLTO RESPIMAT) 2.5-2.5 MCG/ACT AERS Inhale 2 puffs into the lungs daily. 1 Inhaler 0  . vitamin C (ASCORBIC ACID) 500 MG tablet Take 1,000 mg by mouth as needed.     . Vitamin D, Ergocalciferol, (DRISDOL) 50000 units CAPS capsule Take 50,000 Units by mouth every 7 (seven) days.    Marland Kitchen desonide (DESOWEN) 0.05 % cream Apply topically.    . pimecrolimus (ELIDEL) 1 % cream APP TO THE SKIN Q NIGHT    . Tiotropium Bromide-Olodaterol (STIOLTO RESPIMAT) 2.5-2.5 MCG/ACT AERS Inhale 2 puffs into the lungs daily. (Patient not taking: Reported on 05/16/2016) 1 Inhaler 11   Facility-Administered Medications Prior to Visit  Medication Dose Route Frequency Provider Last Rate Last Dose  . 0.9 %  sodium chloride infusion  500 mL Intravenous Continuous Ladene Artist, MD             Objective:   Physical Exam  Vitals:   05/16/16 1524  BP: (!) 92/58  Pulse: 69  SpO2: 90%  Weight: 118 lb (53.5 kg)  Height: 5\' 3"  (1.6 m)   Gen: Pleasant, well-nourished, in no distress,  normal affect  ENT: No lesions,  mouth clear,  oropharynx clear, no postnasal drip  Neck: No JVD, no TMG, no carotid bruits  Lungs: No use of accessory muscles, clear without rales or rhonchi  Cardiovascular: RRR, heart sounds normal, no murmur or gallops, trace bilateral ankle edema  Musculoskeletal: No deformities, no cyanosis or clubbing, no lower extremity cords palpable, no calf pain  Neuro: alert, non focal  Skin: Warm, no lesions or rashes       Assessment & Plan:  COPD (chronic obstructive pulmonary disease) with emphysema She has improved since her exacerbation in the beginning of December. She appears to have benefited from the change to Stiolto (from Spiriva and Foradil). I will continue her on this. She will use albuterol as  needed. She did not want the flu shot because she had a local skin reaction when she got the shot last year. Follow in 4 months.   Baltazar Apo, MD, PhD 05/16/2016, 3:46 PM [facility] [phone] or if no answer [phone]

## 2016-09-06 ENCOUNTER — Ambulatory Visit (INDEPENDENT_AMBULATORY_CARE_PROVIDER_SITE_OTHER): Payer: Medicare Other | Admitting: Emergency Medicine

## 2016-09-06 ENCOUNTER — Encounter: Payer: Self-pay | Admitting: Emergency Medicine

## 2016-09-06 DIAGNOSIS — I2782 Chronic pulmonary embolism: Secondary | ICD-10-CM | POA: Diagnosis not present

## 2016-09-06 DIAGNOSIS — J439 Emphysema, unspecified: Secondary | ICD-10-CM | POA: Diagnosis not present

## 2016-09-06 NOTE — Assessment & Plan Note (Signed)
Chronic. We will not start anticoagulation at this time.

## 2016-09-06 NOTE — Assessment & Plan Note (Signed)
Doing well at this time. She believes that she's benefited from the [facility]. We will continue. She does not tolerate the flu shot due to allergic reaction.  Please continue your Stiolto  Keep albuterol available to use 2 puffs up to every 4 hours if needed for shortness of breath.  Continue zyrtec as you are taking it.  Follow with Dr Lamonte Sakai in 6 months or sooner if you have any problems

## 2016-09-06 NOTE — Progress Notes (Signed)
Subjective:    Patient ID: [name], female    DOB: 16-Sep-1940, 76 y.o.   MRN: [id number]  HPI 76 yo woman, former smoker (40 pack years), history of hyperthyroidism and chronic pulmonary emboli, and followed by Dr Gwenette Greet for COPD. She was last seen in our office by T Parrett at which time her meds were adjusted to Serevent + Spiriva. Since making the change she has been doing better. She feels that she may have lost some ground initially but has rebounded. She rarely uses albuterol. Minimal cough and mucous. No CP or tightness. No flares reported. She does have some SOB with stairs.   ROV 08/03/15 -- follow-up visit for patient with COPD and a history of chronic pulmonary emboli. She is currently managed on Spiriva and Serevent, because foradil stopped manufacturing in the [place].  She has been more dyspneic since our last visit. Some ankle swelling that she has noted over the last month. Bilateral but more on the L. No calf pain.   ROV 11/02/15 -- follow up for COPD and a hx chronic PE (small and chronic, not treated). Drainage is well controlled. She has occasional cough, not every day. Activity is a bit limited, trouble with heat and cold weather. Able to do her house work, limited a bit with her gardening. She has had a local rxn to flu shot before.   ROV 05/16/16 -- This follow-up visit for COPD, history of chronic small pulmonary emboli (not currently on anticoagulation). She was unfortunately went to ED 03/26/16 with an acute exacerbation of her COPD. She was seen here on 04/11/16 Dr Melvyn Novas. She was treated with steroids, avelox, and BD's in the ED. At her OV w Dr Melvyn Novas she did a switch to [employer] She liked the once a day schedule. Didn't seem to lose any ground. She has ProAir, uses about 1-2x a week. She feels that she is improved, closing in on her usual baseline.   ROV 09/06/16 -- Patient has a history of COPD, chronic small pulmonary emboli (not currently anticoagulated). Also with allergic  rhinitis. She reports that her activity level is fairly low - she is not having any SOB with her usual daytime activities. She has curtailed her outdoor gardening. She remains on Stiolto, feels that it is doing well for her. No cough or wheeze. She uses albuterol about once every 2 weeks. She has not had a flare since last time. Prevnar up to date. She does not tolerate th eflu shot.    Review of Systems As per HPI  Past Medical History:  Diagnosis Date  . Allergy   . Anxiety   . Benign neoplasm of kidney   . Bladder tumor   . Blood transfusion   . Blood transfusion without reported diagnosis   . Cancer ([place])    bladder  . Cataract    developing per last eye exam  . Chronic pulmonary embolism ([place])   . Complication of anesthesia 05-07-2010 SURGERY---  POST OP ACUTE HYPOXIA WHEN TAKEN OFF OXYGEN--   SECONDARY COPD STAGE III/ CHRONIC PE  . COPD with emphysema ([place])   . Depression   . Emphysema of lung ([place])   . Hematuria   . History of falling   . Hyperlipidemia   . Hypertension   . Hypothyroid   . Lichen sclerosus   . Osteoporosis      Family History  Problem Relation Age of Onset  . Heart disease Mother   . Stroke Mother   .  Heart disease Father   . Colon cancer Neg Hx   . Esophageal cancer Neg Hx   . Rectal cancer Neg Hx   . Stomach cancer Neg Hx      Social History   Social History  . Marital status: Divorced    Spouse name: N/A  . Number of children: Y  . Years of education: N/A   Occupational History  . 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 Smoking status: Former Smoker    Packs/day: 1.00    Years: 40.00    Types: Cigarettes    Quit date: 04/17/1998  . Smokeless tobacco: Never Used  . Alcohol use No  . Drug use: No  . Sexual activity: Not on file   Other Topics Concern  . Not on file   Social History Narrative  . No narrative on file     Allergies  Allergen Reactions  . Contrast Media [Iodinated  Diagnostic Agents] Diarrhea    Oral contrast   . Penicillins Hives     Outpatient Medications Prior to Visit  Medication Sig Dispense Refill  . albuterol (PROAIR HFA) 108 (90 Base) MCG/ACT inhaler Inhale 2 puffs into the lungs every 6 (six) hours as needed for wheezing or shortness of breath. 1 Inhaler 5  . aspirin 81 MG tablet Take 81 mg by mouth daily.     . Calcium Carb-Cholecalciferol (CALCIUM 600/VITAMIN D3 PO) Take 2 tablets by mouth daily.    . carvedilol (COREG) 12.5 MG tablet Take 12.5 mg by mouth 2 (two) times daily with a meal.     . cetirizine (ZYRTEC) 10 MG tablet Take 10 mg by mouth 2 (two) times daily.     Marland Kitchen levothyroxine (SYNTHROID, LEVOTHROID) 50 MCG tablet As directed    . NIFEdipine (PROCARDIA-XL/ADALAT CC) 60 MG 24 hr tablet Take 60 mg by mouth every morning.     . pravastatin (PRAVACHOL) 40 MG tablet Take 40 mg by mouth every morning.     . Probiotic Product (ALIGN) 4 MG CAPS Take 1 capsule by mouth daily.    . Tiotropium Bromide-Olodaterol (STIOLTO RESPIMAT) 2.5-2.5 MCG/ACT AERS Inhale 2 puffs into the lungs daily. 1 Inhaler 11  . Tiotropium Bromide-Olodaterol (STIOLTO RESPIMAT) 2.5-2.5 MCG/ACT AERS Inhale 2 puffs into the lungs daily. 1 Inhaler 0  . vitamin C (ASCORBIC ACID) 500 MG tablet Take 1,000 mg by mouth as needed.     . Vitamin D, Ergocalciferol, (DRISDOL) 50000 units CAPS capsule Take 50,000 Units by mouth every 7 (seven) days.    Marland Kitchen desonide (DESOWEN) 0.05 % cream Apply topically.    . pimecrolimus (ELIDEL) 1 % cream APP TO THE SKIN Q NIGHT     Facility-Administered Medications Prior to Visit  Medication Dose Route Frequency Provider Last Rate Last Dose  . 0.9 %  sodium chloride infusion  500 mL Intravenous Continuous Ladene Artist, MD             Objective:   Physical Exam  Vitals:   09/06/16 1528  BP: 102/78  Pulse: 71  SpO2: 95%  Weight: 122 lb 12.8 oz (55.7 kg)  Height: 5\' 3"  (1.6 m)   Gen: Pleasant, well-nourished, in no distress,   normal affect  ENT: No lesions,  mouth clear,  oropharynx clear, no postnasal drip  Neck: No JVD, no TMG, no carotid bruits  Lungs: No use of accessory muscles, clear without rales or rhonchi  Cardiovascular: RRR, heart sounds normal,  no murmur or gallops, no edema  Musculoskeletal: No deformities, no cyanosis or clubbing, no lower extremity cords palpable, no calf pain  Neuro: alert, non focal  Skin: Warm, no lesions or rashes     Assessment & Plan:  COPD (chronic obstructive pulmonary disease) with emphysema Doing well at this time. She believes that she's benefited from the [facility]. We will continue. She does not tolerate the flu shot due to allergic reaction.  Please continue your Stiolto  Keep albuterol available to use 2 puffs up to every 4 hours if needed for shortness of breath.  Continue zyrtec as you are taking it.  Follow with Dr Lamonte Sakai in 6 months or sooner if you have any problems  Baltazar Apo, MD, PhD 09/06/2016, 3:41 PM [facility] [phone] or if no answer [phone]

## 2016-09-06 NOTE — Patient Instructions (Addendum)
Please continue your Stiolto  Keep albuterol available to use 2 puffs up to every 4 hours if needed for shortness of breath.  Continue zyrtec as you are taking it.  Follow with Dr Lamonte Sakai in 6 months or sooner if you have any problems

## 2017-03-13 ENCOUNTER — Ambulatory Visit (INDEPENDENT_AMBULATORY_CARE_PROVIDER_SITE_OTHER): Payer: Medicare Other | Admitting: Emergency Medicine

## 2017-03-13 ENCOUNTER — Telehealth: Payer: Self-pay | Admitting: Emergency Medicine

## 2017-03-13 ENCOUNTER — Encounter: Payer: Self-pay | Admitting: Emergency Medicine

## 2017-03-13 DIAGNOSIS — J439 Emphysema, unspecified: Secondary | ICD-10-CM | POA: Diagnosis not present

## 2017-03-13 MED ORDER — TIOTROPIUM BROMIDE-OLODATEROL 2.5-2.5 MCG/ACT IN AERS: 2.0000 | INHALATION_SPRAY | Freq: Every day | RESPIRATORY_TRACT | 11 refills | Status: DC

## 2017-03-13 MED ORDER — TIOTROPIUM BROMIDE-OLODATEROL 2.5-2.5 MCG/ACT IN AERS
2.0000 | INHALATION_SPRAY | Freq: Every day | RESPIRATORY_TRACT | 11 refills | Status: DC
Start: 2017-03-13 — End: 2017-03-20

## 2017-03-13 NOTE — Telephone Encounter (Signed)
Pt is aware that Rx for Stiolto has been sent to [facility] on [address]. Pt voiced her understanding and had no further questions. Nothing further needed.

## 2017-03-13 NOTE — Assessment & Plan Note (Signed)
These continue Stiolto as you are taking it Keep albuterol available to use as needed We will defer the flu shot since you have not tolerated in the past Your pneumonia shots are up-to-date Follow with Dr Lamonte Sakai in 6 months or sooner if you have any problems

## 2017-03-13 NOTE — Patient Instructions (Signed)
These continue Stiolto as you are taking it Keep albuterol available to use as needed We will defer the flu shot since you have not tolerated in the past Your pneumonia shots are up-to-date Follow with Dr Lamonte Sakai in 6 months or sooner if you have any problems

## 2017-03-13 NOTE — Progress Notes (Signed)
   Subjective:    Patient ID: [name], female    DOB: [birth year], 76 y.o.   MRN: [id number]  HPI  ROV 09/06/16 -- Patient has a history of COPD, chronic small pulmonary emboli (not currently anticoagulated). Also with allergic rhinitis. She reports that her activity level is fairly low - she is not having any SOB with her usual daytime activities. She has curtailed her outdoor gardening. She remains on Stiolto, feels that it is doing well for her. No cough or wheeze. She uses albuterol about once every 2 weeks. She has not had a flare since last time. Prevnar up to date. She does not tolerate th eflu shot.   ROV 03/13/17 --this is a follow-up visit for former smoker, history of COPD and chronic small pulmonary emboli, not currently anticoagulated. She is doing well on Stiolto. Rarely uses albuterol but sometimes before exertion. She remains on zyrtec, mainly for pruritis. Has not tolerated the flu shot in the past, wants to avoid.  No exacerbations.  No current wheezing or cough.  Exercise tolerance is stable.   Review of Systems As per HPI     Objective:   Physical Exam  Vitals:   03/13/17 1502  BP: 118/70  Pulse: 83  SpO2: 90%  Weight: 126 lb (57.2 kg)  Height: 5\' 3"  (1.6 m)   Gen: Pleasant, well-nourished, in no distress,  normal affect  ENT: No lesions,  mouth clear,  oropharynx clear, no postnasal drip  Neck: No JVD, no TMG, no carotid bruits                                                                                                                                                                                                         Lungs: No use of accessory muscles, clear without rales or rhonchi  Cardiovascular: RRR, heart sounds normal, no murmur or gallops, no edema  Musculoskeletal: No deformities, no cyanosis or clubbing, no lower extremity cords palpable, no calf pain  Neuro: alert, non focal  Skin: Warm, no lesions or rashes     Assessment & Plan:    COPD (chronic obstructive pulmonary disease) with emphysema These continue Stiolto as you are taking it Keep albuterol available to use as needed We will defer the flu shot since you have not tolerated in the past Your pneumonia shots are up-to-date Follow with Dr Lamonte Sakai in 6 months or sooner if you have any problems  Baltazar Apo, MD, PhD 03/13/2017, 3:15 PM [facility] [phone] or if no answer [phone]

## 2017-03-13 NOTE — Addendum Note (Signed)
Addended by: Desmond Dike C on: 03/13/2017 04:26 PM   Modules accepted: Orders

## 2017-03-20 ENCOUNTER — Other Ambulatory Visit: Payer: Self-pay | Admitting: Internal Medicine

## 2017-09-25 ENCOUNTER — Encounter: Payer: Self-pay | Admitting: Emergency Medicine

## 2017-09-25 ENCOUNTER — Ambulatory Visit (INDEPENDENT_AMBULATORY_CARE_PROVIDER_SITE_OTHER): Payer: Medicare Other | Admitting: Emergency Medicine

## 2017-09-25 DIAGNOSIS — J301 Allergic rhinitis due to pollen: Secondary | ICD-10-CM

## 2017-09-25 DIAGNOSIS — J309 Allergic rhinitis, unspecified: Secondary | ICD-10-CM | POA: Insufficient documentation

## 2017-09-25 DIAGNOSIS — J439 Emphysema, unspecified: Secondary | ICD-10-CM

## 2017-09-25 DIAGNOSIS — I2782 Chronic pulmonary embolism: Secondary | ICD-10-CM

## 2017-09-25 MED ORDER — ALBUTEROL SULFATE HFA 108 (90 BASE) MCG/ACT IN AERS: 2.0000 | INHALATION_SPRAY | RESPIRATORY_TRACT | 5 refills | Status: DC | PRN

## 2017-09-25 NOTE — Assessment & Plan Note (Signed)
Off anticoagulation 

## 2017-09-25 NOTE — Patient Instructions (Addendum)
Please continue Stiolto 2 puffs once a day as you have been taking it. Keep albuterol available to use 2 puffs if needed for shortness of breath, wheezing, chest tightness. Continue Zyrtec once daily as you have been taking it. Follow with Dr Lamonte Sakai in 12 months or sooner if you have any problems

## 2017-09-25 NOTE — Progress Notes (Signed)
Subjective:    Patient ID: [name], female    DOB: 1941/01/29, 77 y.o.   MRN: [id number]  HPI  ROV 09/06/16 -- Patient has a history of COPD, chronic small pulmonary emboli (not currently anticoagulated). Also with allergic rhinitis. She reports that her activity level is fairly low - she is not having any SOB with her usual daytime activities. She has curtailed her outdoor gardening. She remains on Stiolto, feels that it is doing well for her. No cough or wheeze. She uses albuterol about once every 2 weeks. She has not had a flare since last time. Prevnar up to date. She does not tolerate th eflu shot.   ROV 03/13/17 --this is a follow-up visit for former smoker, history of COPD and chronic small pulmonary emboli, not currently anticoagulated. She is doing well on Stiolto. Rarely uses albuterol but sometimes before exertion. She remains on zyrtec, mainly for pruritis. Has not tolerated the flu shot in the past, wants to avoid.  No exacerbations.  No current wheezing or cough.  Exercise tolerance is stable.  ROV 09/25/17 --[name] is a 77 year old woman with a history of tobacco use and associated COPD, allergic rhinitis.  She also has history of chronic small pulmonary emboli.  She is not currently on anticoagulation. She has been dealing with some skin issues with dermatology. She has not had any flares since last visit, but she feels that her overall breathing may be a but more limited than last visit. She has been less active. Remains on Stiolto. She rarely uses albuterol, sometimes needs it with climbing stairs. No wheeze or cough.    Review of Systems As per HPI     Objective:   Physical Exam  Vitals:   09/25/17 1523  BP: 102/64  Pulse: 72  SpO2: 92%  Weight: 124 lb (56.2 kg)  Height: 5\' 3"  (1.6 m)   Gen: Pleasant, thin, elderly woman, in no distress,  normal affect  ENT: No lesions,  mouth clear,  oropharynx clear, no postnasal drip  Neck: No JVD, no stridor                                                                                                                                                                                                       Lungs: No use of accessory muscles, clear without rales or rhonchi  Cardiovascular: RRR, heart sounds normal, no murmur or gallops, no edema  Musculoskeletal: No deformities, no cyanosis or clubbing, no lower extremity cords palpable, no calf pain  Neuro: alert, non focal  Skin:  Warm, no lesions or rashes     Assessment & Plan:  COPD (chronic obstructive pulmonary disease) with emphysema Please continue Stiolto 2 puffs once a day as you have been taking it. Keep albuterol available to use 2 puffs if needed for shortness of breath, wheezing, chest tightness. Follow with Dr Lamonte Sakai in 12 months or sooner if you have any problems  Allergic rhinitis Continue Zyrtec once daily as you have been taking it.  Pulmonary embolism ([place]) Off anticoagulation  Baltazar Apo, MD, PhD 09/25/2017, 3:52 PM [facility] [phone] or if no answer [phone]

## 2017-09-25 NOTE — Assessment & Plan Note (Signed)
Continue Zyrtec once daily as you have been taking it.

## 2017-09-25 NOTE — Assessment & Plan Note (Signed)
Please continue Stiolto 2 puffs once a day as you have been taking it. Keep albuterol available to use 2 puffs if needed for shortness of breath, wheezing, chest tightness. Follow with Dr Lamonte Sakai in 12 months or sooner if you have any problems

## 2018-02-26 ENCOUNTER — Other Ambulatory Visit: Payer: Self-pay | Admitting: Emergency Medicine

## 2018-05-15 IMAGING — DX DG CHEST 1V PORT
1 series · 1 of 1 positions shown · non-contrast
Comparison: 12/07/2014

CLINICAL DATA: Shortness of breath, coughing and wheezing. Chest
tightness.

EXAM:
PORTABLE CHEST 1 VIEW

[chest ap]
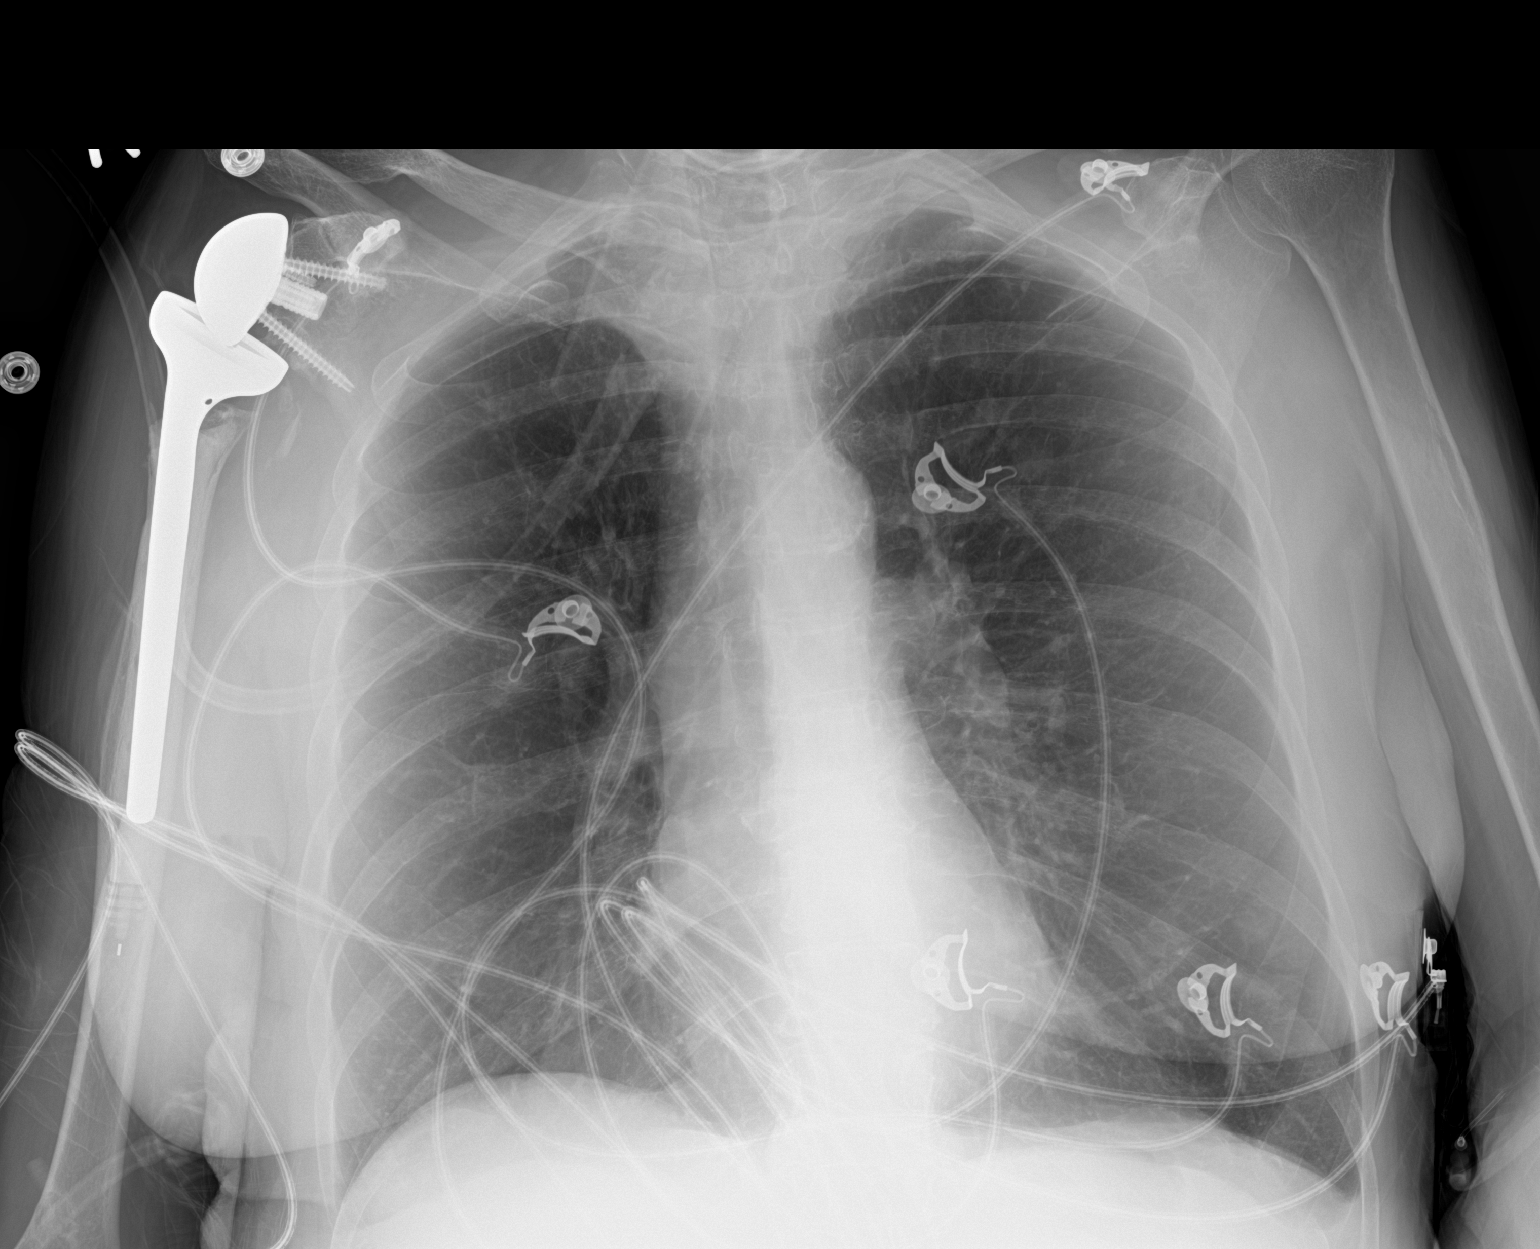

[1 of 1 positions shown; findings below may reference images not displayed]

FINDINGS: Cardiomediastinal silhouette is normal. Mediastinal contours appear
intact. Calcific atherosclerotic disease of the aorta.

There is no evidence of focal airspace consolidation, pleural
effusion or pneumothorax. Chronic emphysematous changes and
hyperinflation of the lungs.

Osseous structures are without acute abnormality. Right shoulder
arthroplasty is noted. Soft tissues are grossly normal.
IMPRESSION: Chronic changes of COPD.

No evidence of focal airspace consolidation.

Calcific atherosclerotic disease of the aorta.

## 2018-09-27 ENCOUNTER — Other Ambulatory Visit: Payer: Self-pay

## 2018-09-27 ENCOUNTER — Ambulatory Visit (INDEPENDENT_AMBULATORY_CARE_PROVIDER_SITE_OTHER): Payer: Medicare Other | Admitting: Emergency Medicine

## 2018-09-27 ENCOUNTER — Encounter: Payer: Self-pay | Admitting: Emergency Medicine

## 2018-09-27 DIAGNOSIS — I2782 Chronic pulmonary embolism: Secondary | ICD-10-CM | POA: Diagnosis not present

## 2018-09-27 DIAGNOSIS — J439 Emphysema, unspecified: Secondary | ICD-10-CM | POA: Diagnosis not present

## 2018-09-27 NOTE — Progress Notes (Signed)
Virtual Visit via Telephone Note  I connected with [name] on 09/27/18 at 12:00 PM EDT by telephone and verified that I am speaking with the correct person using two identifiers.  Location: Patient: Home  Provider: Office   I discussed the limitations, risks, security and privacy concerns of performing an evaluation and management service by telephone and the availability of in person appointments. I also discussed with the patient that there may be a patient responsible charge related to this service. The patient expressed understanding and agreed to proceed.   History of Present Illness:  ROV 09/25/17 --[name] is a 78 year old woman with a history of tobacco use and associated COPD, allergic rhinitis.  She also has history of chronic small pulmonary emboli.  She is not currently on anticoagulation. She has been dealing with some skin issues with dermatology. She has not had any flares since last visit, but she feels that her overall breathing may be a but more limited than last visit. She has been less active. Remains on Stiolto. She rarely uses albuterol, sometimes needs it with climbing stairs. No wheeze or cough.  Telephone visit 09/27/2018 --pleasant 78 year old woman with a history of tobacco use, COPD and allergic rhinitis.  She also has a history of chronic small pulmonary emboli off anticoagulation.    Observations/Objective: She has overall been fairly well. She has noticed some dyspnea recently when she was eating - difficulty eating and breathing at the same time. She cannot recall whether she used her SABA at that time.  She has been able to get around and do most of her usually activities. Notices now that she has to rest after climbing the stairs. She is not on O2. She is on Stiolto, tolerates well, feels that she benefits from it. Occasionally she is unsure whether she gets it into her chest effectively.  She has been using her albuterol a bit more with climbing the  stairs. No exacerbations reported. Uses her zyrtec bid for rhinitis and skin issues. She had a dry cough about 2 weeks ago, resolved. No other URI sx. She is affected by the weather.    Assessment and Plan: Pulmonary embolism (HCC) Hx prior chronic PE's. ? Whether she would benefit from V/q scan, even possible resumption anticoagulation in future depending on course. Consider TTE to eval for PAH at some point./  COPD (chronic obstructive pulmonary disease) with emphysema There may be some slight progression and her exertional dyspnea.  She notices this especially with climbing stairs.  She is also needed to use her albuterol with more frequency with this type of exertion.  I question whether she may be desaturating.  Talked to her about coming in for a walking oximetry.  She is about to get an oximeter herself and she is going to do some spot checks at home to see if she desaturates.  I explained our goal is to keep her SPO2 greater than 90%.  I will see her back in 3 months and we will do a formal walking oximetry at that time.  For now continue her Stiolto.  She does not exacerbate frequently and I do not think there is an indication for an ICS.  She may have had thrush with ICS in the past based on Dr. Buck Mam notes.  Continue albuterol as needed.  Flu shot in the fall.  Follow-up in 3 months    Follow Up Instructions: Will follow up with her at a standard OV in 3 months.  I discussed the assessment and treatment plan with the patient. The patient was provided an opportunity to ask questions and all were answered. The patient agreed with the plan and demonstrated an understanding of the instructions.   The patient was advised to call back or seek an in-person evaluation if the symptoms worsen or if the condition fails to improve as anticipated.  I provided 23 minutes of non-face-to-face time during this encounter.   Collene Gobble, MD

## 2018-09-27 NOTE — Assessment & Plan Note (Addendum)
Hx prior chronic PE's. ? Whether she would benefit from V/q scan, even possible resumption anticoagulation in future depending on course. Consider TTE to eval for PAH at some point./

## 2018-09-27 NOTE — Assessment & Plan Note (Signed)
There may be some slight progression and her exertional dyspnea.  She notices this especially with climbing stairs.  She is also needed to use her albuterol with more frequency with this type of exertion.  I question whether she may be desaturating.  Talked to her about coming in for a walking oximetry.  She is about to get an oximeter herself and she is going to do some spot checks at home to see if she desaturates.  I explained our goal is to keep her SPO2 greater than 90%.  I will see her back in 3 months and we will do a formal walking oximetry at that time.  For now continue her Stiolto.  She does not exacerbate frequently and I do not think there is an indication for an ICS.  She may have had thrush with ICS in the past based on Dr. Buck Mam notes.  Continue albuterol as needed.  Flu shot in the fall.  Follow-up in 3 months

## 2018-09-30 ENCOUNTER — Other Ambulatory Visit: Payer: Self-pay | Admitting: Emergency Medicine

## 2018-11-25 ENCOUNTER — Encounter: Payer: Self-pay | Admitting: Gastroenterology

## 2019-01-01 ENCOUNTER — Encounter: Payer: Self-pay | Admitting: Emergency Medicine

## 2019-01-01 ENCOUNTER — Other Ambulatory Visit: Payer: Self-pay

## 2019-01-01 ENCOUNTER — Ambulatory Visit (INDEPENDENT_AMBULATORY_CARE_PROVIDER_SITE_OTHER): Payer: Medicare Other | Admitting: Emergency Medicine

## 2019-01-01 DIAGNOSIS — J439 Emphysema, unspecified: Secondary | ICD-10-CM

## 2019-01-01 DIAGNOSIS — I2782 Chronic pulmonary embolism: Secondary | ICD-10-CM | POA: Diagnosis not present

## 2019-01-01 NOTE — Assessment & Plan Note (Signed)
Depending on how your breathing is doing at your next visit we will discuss possibly performing an echocardiogram to evaluate your pulmonary pressures which can be affected by chronic blood clots.

## 2019-01-01 NOTE — Patient Instructions (Signed)
Please continue Stiolto 2 puffs once daily as you have been taking it. Keep your albuterol available to use 2 puffs if needed for shortness of breath, chest tightness, wheezing. We will perform a walking oximetry on room air at your next visit. Start working on slowly increasing your exercise and stamina. Depending on how your breathing is doing at your next visit we will discuss possibly performing an echocardiogram to evaluate your pulmonary pressures which can be affected by chronic blood clots. Follow with Dr Lamonte Sakai in 3 months or sooner if you have any problems.

## 2019-01-01 NOTE — Assessment & Plan Note (Signed)
Please continue Stiolto 2 puffs once daily as you have been taking it. Keep your albuterol available to use 2 puffs if needed for shortness of breath, chest tightness, wheezing. We will perform a walking oximetry on room air at your next visit. Start working on slowly increasing your exercise and stamina. Follow with Dr Lamonte Sakai in 3 months or sooner if you have any problems

## 2019-01-01 NOTE — Progress Notes (Signed)
Subjective:    Patient ID: [name], female    DOB: [birth year], 78 y.o.   MRN: [id number]  HPI  ROV 09/06/16 -- Patient has a history of COPD, chronic small pulmonary emboli (not currently anticoagulated). Also with allergic rhinitis. She reports that her activity level is fairly low - she is not having any SOB with her usual daytime activities. She has curtailed her outdoor gardening. She remains on Stiolto, feels that it is doing well for her. No cough or wheeze. She uses albuterol about once every 2 weeks. She has not had a flare since last time. Prevnar up to date. She does not tolerate the flu shot.   ROV 03/13/17 --this is a follow-up visit for former smoker, history of COPD and chronic small pulmonary emboli, not currently anticoagulated. She is doing well on Stiolto. Rarely uses albuterol but sometimes before exertion. She remains on zyrtec, mainly for pruritis. Has not tolerated the flu shot in the past, wants to avoid.  No exacerbations.  No current wheezing or cough.  Exercise tolerance is stable.  ROV 09/25/17 --[name] is a 78 year old woman with a history of tobacco use and associated COPD, allergic rhinitis.  She also has history of chronic small pulmonary emboli.  She is not currently on anticoagulation. She has been dealing with some skin issues with dermatology. She has not had any flares since last visit, but she feels that her overall breathing may be a but more limited than last visit. She has been less active. Remains on Stiolto. She rarely uses albuterol, sometimes needs it with climbing stairs. No wheeze or cough.   ROV 01/01/2019 --78 year old woman with COPD and allergic rhinitis.  Also with a history of chronic small pulmonary emboli.  We had a phone visit about 3 months ago.  She has been managed with Stiolto, feels that she benefits. She tried using an oximeter with exertion at home, ranges from 88 to mid-90's. Uses albuterol seldomly. Minimal cough. No sputum.     Review of Systems As per HPI     Objective:   Physical Exam  Vitals:   01/01/19 1614  BP: (!) 142/82  Pulse: 80  SpO2: 90%  Weight: 128 lb (58.1 kg)  Height: 5\' 3"  (1.6 m)   Gen: Pleasant, thin, elderly woman, in no distress,  normal affect  ENT: No lesions,  mouth clear,  oropharynx clear, no postnasal drip  Neck: No JVD, no stridor  Lungs: No use of accessory muscles, clear without rales or rhonchi  Cardiovascular: RRR, heart sounds normal, no murmur or gallops, no edema  Musculoskeletal: No deformities, no cyanosis or clubbing, no lower extremity cords palpable, no calf pain  Neuro: alert, non focal  Skin: Warm, no lesions or rashes     Assessment & Plan:  COPD (chronic obstructive pulmonary disease) with emphysema Please continue Stiolto 2 puffs once daily as you have been taking it. Keep your albuterol available to use 2 puffs if needed for shortness of breath, chest tightness, wheezing. We will perform a walking oximetry on room air at your next visit. Start working on slowly increasing your exercise and stamina. Follow with Dr Lamonte Sakai in 3 months or sooner if you have any problems  Pulmonary embolism [facility]) Depending on how your breathing is doing at your next visit we will discuss possibly performing an echocardiogram to evaluate your pulmonary pressures which can be affected by chronic blood clots.  Baltazar Apo, MD, PhD 01/01/2019, 4:39 PM [facility] [phone] or if no answer [phone]

## 2019-03-18 ENCOUNTER — Other Ambulatory Visit: Payer: Self-pay | Admitting: Emergency Medicine

## 2019-03-28 ENCOUNTER — Ambulatory Visit (INDEPENDENT_AMBULATORY_CARE_PROVIDER_SITE_OTHER): Payer: Medicare Other | Admitting: Emergency Medicine

## 2019-03-28 ENCOUNTER — Other Ambulatory Visit: Payer: Self-pay

## 2019-03-28 ENCOUNTER — Encounter: Payer: Self-pay | Admitting: Emergency Medicine

## 2019-03-28 DIAGNOSIS — J439 Emphysema, unspecified: Secondary | ICD-10-CM | POA: Diagnosis not present

## 2019-03-28 NOTE — Progress Notes (Signed)
Virtual Visit via Telephone Note  I connected with [name] on 03/28/19 at  4:00 PM EST by telephone and verified that I am speaking with the correct person using two identifiers.  Location: Patient: Home Provider: Office   I discussed the limitations, risks, security and privacy concerns of performing an evaluation and management service by telephone and the availability of in person appointments. I also discussed with the patient that there may be a patient responsible charge related to this service. The patient expressed understanding and agreed to proceed.   History of Present Illness: Ms. [name] is 77, followed for COPD, history of chronic small pulmonary emboli off anticoagulation.   Observations/Objective: She has been feeling about the same. She has been working trying to increase her exercise some. She was at her PCP recently, had an SpO2 as low as 90% there. At home she has seen 88 - 90's%. She especially has trouble with arm movements, bending. She remains on Stiolto, believes that she benefits from this. She is using albuterol about 0-2x a day. No cough, no wheeze. No flares since last time. She is on zyrtec bid. She has had a rxn to the flu shot before, no longer gets this.   Assessment and Plan: Continue Stiolto qd, albuterol prn Continue zyrtec bid She needs a walking oximetry on RA, suspect she will qualify for supplemental O2. If so then we will order She tells me that she does not want the flu shot, will not want the C19 shot due to sensitivity rxn in the past > sore swollen deltoid, then an overlying rash.   Follow Up Instructions: 6 months w RB Walking oximetry now.    I discussed the assessment and treatment plan with the patient. The patient was provided an opportunity to ask questions and all were answered. The patient agreed with the plan and demonstrated an understanding of the instructions.   The patient was advised to call back or seek an in-person  evaluation if the symptoms worsen or if the condition fails to improve as anticipated.  I provided 15 minutes of non-face-to-face time during this encounter.   Collene Gobble, MD

## 2019-05-07 ENCOUNTER — Ambulatory Visit: Payer: Medicare Other

## 2019-05-07 ENCOUNTER — Other Ambulatory Visit: Payer: Self-pay

## 2019-05-07 ENCOUNTER — Telehealth: Payer: Self-pay | Admitting: Emergency Medicine

## 2019-05-07 DIAGNOSIS — J439 Emphysema, unspecified: Secondary | ICD-10-CM

## 2019-05-07 NOTE — Progress Notes (Signed)
Patient came into office today for a qualifying walk. Patient walked 1/2 a lap before she had to stop O2 sats dropped to 85% patient placed on 2L pulsed O2 and sat 87% patient put on 4L pulsed O2 sat 90% walked 1 lap dropped sats to 85%, put patient on continuous 2L oxygen and she walked a whole lap staying 94%. Patient order placed for new O2. CC note made.   Patient also moving to [facility] with her daughter January 29.2021. Will place this note in [facility] order.   Patient wondering about follow ups with Dr. Lamonte Sakai. I told patient I would forward this to his nurse Ria Comment and they would follow up with patient. Patient states she believes her daughter will be able to bring her in the future for appointments but wants to talk to Dr. Lamonte Sakai.

## 2019-05-07 NOTE — Telephone Encounter (Signed)
Patient came into office today for a qualifying walk. Patient walked 1/2 a lap before she had to stop O2 sats dropped to 85% patient placed on 2L pulsed O2 and sat 87% patient put on 4L pulsed O2 sat 90% walked 1 lap dropped sats to 85%, put patient on continuous 2L oxygen and she walked a whole lap staying 94%. Patient order placed for new O2. CC note made.   Patient also moving to [facility] with her daughter January 29.2021. Will place this note in [facility] order.   Patient wondering about follow ups with Dr. Lamonte Sakai. I told patient I would forward this to his nurse Ria Comment and they would follow up with patient. Patient states she believes her daughter will be able to bring her in the future for appointments but wants to talk to Dr. Lamonte Sakai.

## 2019-05-08 ENCOUNTER — Telehealth: Payer: Self-pay | Admitting: Emergency Medicine

## 2019-05-08 NOTE — Telephone Encounter (Signed)
Spoke with pt and scheduled her for face to face with RB for tomorrow

## 2019-05-09 ENCOUNTER — Other Ambulatory Visit: Payer: Self-pay | Admitting: General Surgery

## 2019-05-09 ENCOUNTER — Ambulatory Visit (INDEPENDENT_AMBULATORY_CARE_PROVIDER_SITE_OTHER): Payer: Medicare Other | Admitting: Emergency Medicine

## 2019-05-09 ENCOUNTER — Other Ambulatory Visit: Payer: Self-pay

## 2019-05-09 ENCOUNTER — Encounter: Payer: Self-pay | Admitting: Emergency Medicine

## 2019-05-09 DIAGNOSIS — J9611 Chronic respiratory failure with hypoxia: Secondary | ICD-10-CM

## 2019-05-09 DIAGNOSIS — J439 Emphysema, unspecified: Secondary | ICD-10-CM

## 2019-05-09 DIAGNOSIS — I2782 Chronic pulmonary embolism: Secondary | ICD-10-CM | POA: Diagnosis not present

## 2019-05-09 MED ORDER — ALBUTEROL SULFATE HFA 108 (90 BASE) MCG/ACT IN AERS: 2.0000 | INHALATION_SPRAY | RESPIRATORY_TRACT | 12 refills | Status: AC | PRN

## 2019-05-09 MED ORDER — STIOLTO RESPIMAT 2.5-2.5 MCG/ACT IN AERS: 2.0000 | INHALATION_SPRAY | Freq: Every day | RESPIRATORY_TRACT | 12 refills | Status: DC

## 2019-05-09 NOTE — Assessment & Plan Note (Signed)
She has a history of chronic PE noted on a CT chest from 2012.  Lower extremity Dopplers at that time were negative.  She is now requiring oxygen and is unclear to me whether there is any contribution of chronic thromboembolic disease at this time.  I would like to get her on stable oxygen therapy, then perform an echocardiogram in several months to see if there is any evidence for pulmonary hypertension.  If so then it may be reasonable for [place] to perform a V/Q scan or CT-PA, reconsider chronic anticoagulation.

## 2019-05-09 NOTE — Assessment & Plan Note (Signed)
New exertional oxygen need noted on walking oximetry this week.  We will order 2 L/min continuous flow with exertion.  She was unable to trigger pulsed flow and will not be able to use a POC.  We will get her the lightest tanks possible.

## 2019-05-09 NOTE — Progress Notes (Signed)
Subjective:    Patient ID: [name], female    DOB: Nov 29, 1940, 79 y.o.   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  She is not currently on anticoagulation.  She is managed with Stiolto, albuterol which she uses.  At our last phone visit she noted that she had had some borderline desaturations with exertion at her PCP.  We arranged for an ambulatory oximetry which was done this week, did reveal desaturations.  She was titrated to 2 L/min continuous flow. She is having occasional cough, non productive. No wheeze. She uses albuterol very rarely. PNA shot up to date. She has had swelling after flu shot before.    Review of Systems As per HPI     Objective:   Physical Exam  Vitals:   05/09/19 1524  BP: 122/64  Pulse: 85  Temp: (!) 97.2 F (36.2 C)  TempSrc: Temporal  SpO2: 93%  Weight: 131 lb 9.6 oz (59.7 kg)  Height: 5\' 3"  (1.6 m)   Gen: Pleasant, thin, elderly woman, in no distress,  normal affect  ENT: No lesions,  mouth clear,  oropharynx clear, no postnasal drip  Neck: No JVD, no stridor                                                                                                                                                                                                      Lungs: No use of accessory muscles, clear without rales or rhonchi  Cardiovascular: RRR, heart sounds normal, no murmur or gallops, no edema  Musculoskeletal: No deformities, no cyanosis or clubbing, no lower extremity cords palpable, no calf pain  Neuro: alert, non focal  Skin: Warm, no lesions or rashes     Assessment & Plan:  COPD (chronic obstructive pulmonary disease) with emphysema Please continue Stiolto 2 puffs once daily as you have been taking it. Keep your albuterol available to use 2 puffs if needed for shortness  of breath, chest tightness, wheezing.  Your pneumonia shot is up-to-date. Based on the local reaction that you experienced when you have had the flu shot, I recommend that you defer getting the COVID-19 vaccine. Continue to practice social distancing, mask wearing, handwashing Follow with Dr Lamonte Sakai in 6 months or sooner if you have any problems  Pulmonary embolism [facility]) She has a history of chronic PE noted on a CT chest from 2012.  Lower extremity Dopplers at that time were  negative.  She is now requiring oxygen and is unclear to me whether there is any contribution of chronic thromboembolic disease at this time.  I would like to get her on stable oxygen therapy, then perform an echocardiogram in several months to see if there is any evidence for pulmonary hypertension.  If so then it may be reasonable for [place] to perform a V/Q scan or CT-PA, reconsider chronic anticoagulation.  Chronic hypoxemic respiratory failure (HCC) New exertional oxygen need noted on walking oximetry this week.  We will order 2 L/min continuous flow with exertion.  She was unable to trigger pulsed flow and will not be able to use a POC.  We will get her the lightest tanks possible.  Baltazar Apo, MD, PhD 05/09/2019, 3:53 PM [facility] [phone] or if no answer [phone]

## 2019-05-09 NOTE — Patient Instructions (Signed)
Please continue Stiolto 2 puffs once daily as you have been taking it. Keep your albuterol available to use 2 puffs if needed for shortness of breath, chest tightness, wheezing. Your walking oxygen test shows that you need to wear 2 L/min continuous flow oxygen with all exertion.  We will arrange for you to get this through [employer].  Your pneumonia shot is up-to-date. Based on the local reaction that you experienced when you have had the flu shot, I recommend that you defer getting the COVID-19 vaccine. Continue to practice social distancing, mask wearing, handwashing Follow with Dr Lamonte Sakai in 6 months or sooner if you have any problems

## 2019-05-09 NOTE — Assessment & Plan Note (Signed)
Please continue Stiolto 2 puffs once daily as you have been taking it. Keep your albuterol available to use 2 puffs if needed for shortness of breath, chest tightness, wheezing.  Your pneumonia shot is up-to-date. Based on the local reaction that you experienced when you have had the flu shot, I recommend that you defer getting the COVID-19 vaccine. Continue to practice social distancing, mask wearing, handwashing Follow with Dr Lamonte Sakai in 6 months or sooner if you have any problems

## 2019-05-14 ENCOUNTER — Telehealth: Payer: Self-pay | Admitting: Emergency Medicine

## 2019-05-14 NOTE — Telephone Encounter (Signed)
Spoke with the pt  I explained that the tank from here was an [employer] tank and can be returned to them  She verbalized understanding  Nothing further needed

## 2019-11-11 ENCOUNTER — Ambulatory Visit: Payer: Medicare Other | Admitting: Emergency Medicine

## 2019-11-11 ENCOUNTER — Emergency Department (HOSPITAL_COMMUNITY)
Admission: EM | Admit: 2019-11-11 | Discharge: 2019-11-11 | Disposition: A | Discharge status: Home / Self Care | Payer: Medicare Other | Attending: Emergency Medicine | Admitting: Emergency Medicine

## 2019-11-11 ENCOUNTER — Other Ambulatory Visit: Payer: Self-pay

## 2019-11-11 ENCOUNTER — Encounter (HOSPITAL_COMMUNITY): Payer: Self-pay | Admitting: Obstetrics and Gynecology

## 2019-11-11 ENCOUNTER — Emergency Department (HOSPITAL_COMMUNITY): Payer: Medicare Other

## 2019-11-11 DIAGNOSIS — Z7951 Long term (current) use of inhaled steroids: Secondary | ICD-10-CM | POA: Diagnosis not present

## 2019-11-11 DIAGNOSIS — Z8551 Personal history of malignant neoplasm of bladder: Secondary | ICD-10-CM | POA: Diagnosis not present

## 2019-11-11 DIAGNOSIS — R0789 Other chest pain: Secondary | ICD-10-CM | POA: Insufficient documentation

## 2019-11-11 DIAGNOSIS — S3992XA Unspecified injury of lower back, initial encounter: Secondary | ICD-10-CM | POA: Diagnosis present

## 2019-11-11 DIAGNOSIS — S39012A Strain of muscle, fascia and tendon of lower back, initial encounter: Secondary | ICD-10-CM | POA: Insufficient documentation

## 2019-11-11 DIAGNOSIS — E039 Hypothyroidism, unspecified: Secondary | ICD-10-CM | POA: Diagnosis not present

## 2019-11-11 DIAGNOSIS — I10 Essential (primary) hypertension: Secondary | ICD-10-CM | POA: Diagnosis not present

## 2019-11-11 DIAGNOSIS — Z85528 Personal history of other malignant neoplasm of kidney: Secondary | ICD-10-CM | POA: Insufficient documentation

## 2019-11-11 DIAGNOSIS — J439 Emphysema, unspecified: Secondary | ICD-10-CM | POA: Diagnosis not present

## 2019-11-11 DIAGNOSIS — Z7982 Long term (current) use of aspirin: Secondary | ICD-10-CM | POA: Insufficient documentation

## 2019-11-11 DIAGNOSIS — Y999 Unspecified external cause status: Secondary | ICD-10-CM | POA: Diagnosis not present

## 2019-11-11 DIAGNOSIS — Y939 Activity, unspecified: Secondary | ICD-10-CM | POA: Insufficient documentation

## 2019-11-11 DIAGNOSIS — Z79899 Other long term (current) drug therapy: Secondary | ICD-10-CM | POA: Insufficient documentation

## 2019-11-11 DIAGNOSIS — Y92410 Unspecified street and highway as the place of occurrence of the external cause: Secondary | ICD-10-CM | POA: Insufficient documentation

## 2019-11-11 DIAGNOSIS — Z87891 Personal history of nicotine dependence: Secondary | ICD-10-CM | POA: Insufficient documentation

## 2019-11-11 MED ORDER — IBUPROFEN 200 MG PO TABS
600.0000 mg | ORAL_TABLET | Freq: Once | ORAL | Status: AC
  Administered 2019-11-11: 17:00:00 600 mg via ORAL
  Filled 2019-11-11: qty 3

## 2019-11-11 MED ORDER — CYCLOBENZAPRINE HCL 10 MG PO TABS
5.0000 mg | ORAL_TABLET | Freq: Once | ORAL | Status: AC
  Administered 2019-11-11: 17:00:00 5 mg via ORAL
  Filled 2019-11-11: qty 1

## 2019-11-11 NOTE — Discharge Instructions (Addendum)
Take Flexeril as prescribed for muscle spasms.  Take Motrin for pain.  See your doctor for follow-up  Return to ER if you have worse chest pain, back pain, weakness, numbness.
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
## 2020-04-27 ENCOUNTER — Other Ambulatory Visit: Payer: Self-pay | Admitting: Emergency Medicine

## 2020-04-27 DIAGNOSIS — J439 Emphysema, unspecified: Secondary | ICD-10-CM

## 2020-04-27 DIAGNOSIS — J9611 Chronic respiratory failure with hypoxia: Secondary | ICD-10-CM
